# Patient Record
Sex: Female | Born: 1985 | Race: Black or African American | Hispanic: No | Marital: Single | Smoking: Current every day smoker
Health system: Southern US, Community
[De-identification: ages and names within clinical notes are randomized; demographics above are authoritative.]

---
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
## 2007-05-26 ENCOUNTER — Emergency Department (HOSPITAL_COMMUNITY): Admission: EM | Admit: 2007-05-26 | Discharge: 2007-05-26 | Payer: Self-pay | Admitting: Emergency Medicine

## 2007-09-26 ENCOUNTER — Emergency Department (HOSPITAL_COMMUNITY): Admission: EM | Admit: 2007-09-26 | Discharge: 2007-09-26 | Payer: Self-pay | Admitting: Emergency Medicine

## 2009-10-15 ENCOUNTER — Inpatient Hospital Stay (HOSPITAL_COMMUNITY): Admission: AD | Admit: 2009-10-15 | Discharge: 2009-10-15 | Payer: Self-pay | Admitting: Family Medicine

## 2009-10-15 ENCOUNTER — Ambulatory Visit: Payer: Self-pay | Admitting: Obstetrics and Gynecology

## 2010-10-22 LAB — URINE MICROSCOPIC-ADD ON

## 2010-10-22 LAB — CBC
HCT: 39 % (ref 36.0–46.0)
Hemoglobin: 12.9 g/dL (ref 12.0–15.0)
MCV: 100.3 fL — ABNORMAL HIGH (ref 78.0–100.0)
Platelets: 230 10*3/uL (ref 150–400)
RDW: 12.7 % (ref 11.5–15.5)

## 2010-10-22 LAB — URINALYSIS, ROUTINE W REFLEX MICROSCOPIC
Bilirubin Urine: NEGATIVE
Ketones, ur: 15 mg/dL — AB
Nitrite: NEGATIVE
pH: >9 — ABNORMAL HIGH (ref 5.0–8.0)

## 2010-10-22 LAB — WET PREP, GENITAL: Trich, Wet Prep: NONE SEEN

## 2011-04-19 LAB — PREGNANCY, URINE: Preg Test, Ur: NEGATIVE

## 2011-04-19 LAB — URINALYSIS, ROUTINE W REFLEX MICROSCOPIC
Ketones, ur: NEGATIVE
Nitrite: NEGATIVE
Specific Gravity, Urine: 1.013
Urobilinogen, UA: 1
pH: 7

## 2011-04-19 LAB — RPR: RPR Ser Ql: NONREACTIVE

## 2011-05-08 LAB — WET PREP, GENITAL
Clue Cells Wet Prep HPF POC: NONE SEEN
WBC, Wet Prep HPF POC: NONE SEEN

## 2011-05-09 ENCOUNTER — Inpatient Hospital Stay (INDEPENDENT_AMBULATORY_CARE_PROVIDER_SITE_OTHER)
Admission: RE | Admit: 2011-05-09 | Discharge: 2011-05-09 | Disposition: A | Discharge status: Home / Self Care | Payer: Self-pay | Source: Ambulatory Visit | Attending: Emergency Medicine | Admitting: Emergency Medicine

## 2011-05-09 DIAGNOSIS — S20219A Contusion of unspecified front wall of thorax, initial encounter: Secondary | ICD-10-CM

## 2011-08-10 IMAGING — CT CT ABDOMEN W/ CM
1 of 2 series · 16 of 32 positions shown, 20 images · IV contrast (OMNIPAQUE)
Comparison: None.

CLINICAL DATA: Abdominal pain.  Nausea, vomiting and diarrhea.

CT ABDOMEN AND PELVIS WITH CONTRAST
TECHNIQUE: Multidetector CT imaging of the abdomen and pelvis was
performed using the standard protocol following bolus
administration of intravenous contrast.
Contrast: 100 ml [id number].

[Series 2: routine abdomen/pelvis with · axial · 0.57mm/px · z∈[-259,+131]mm · 16 of 86 slices shown, 20 images]
[im 4/86  soft-tissue]
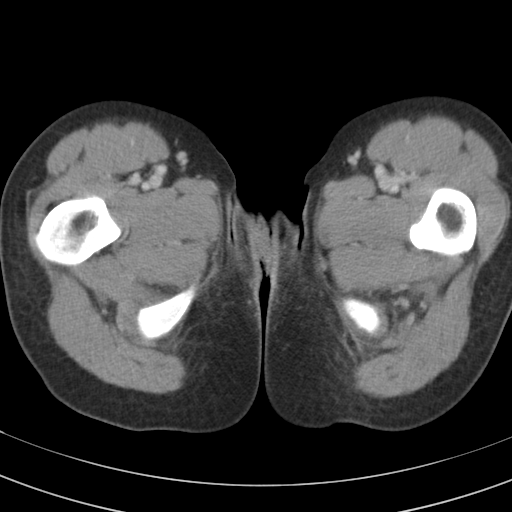
[im 4/86  bone]
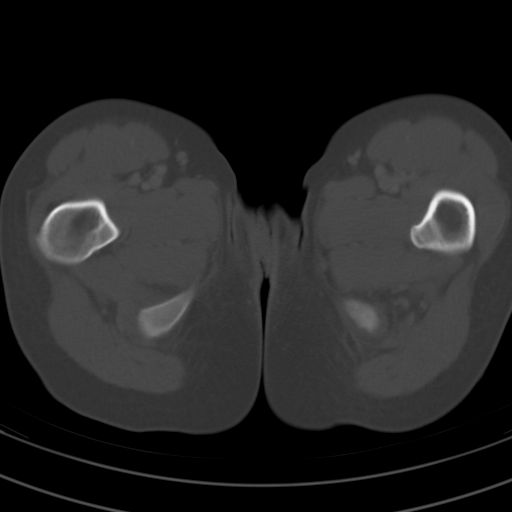
[im 11/86  soft-tissue]
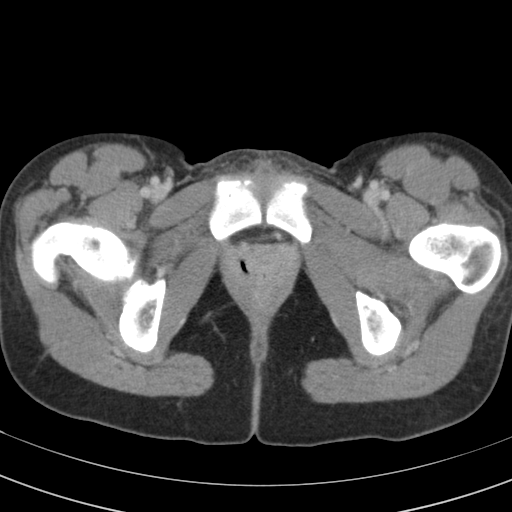
[im 18/86  soft-tissue]
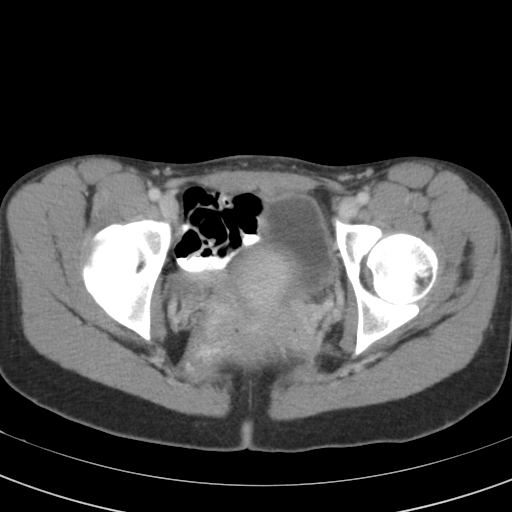
[im 24/86  soft-tissue]
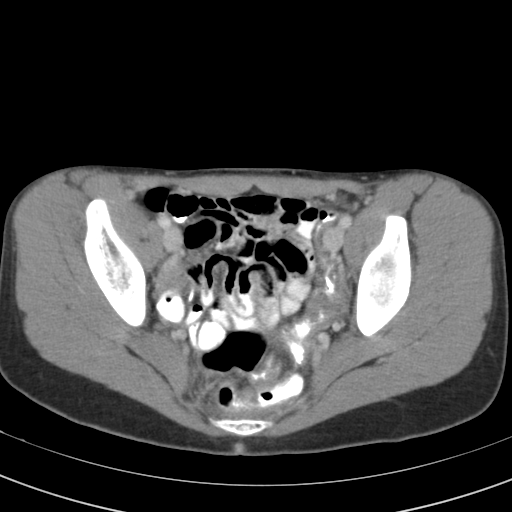
[im 28/86  soft-tissue]
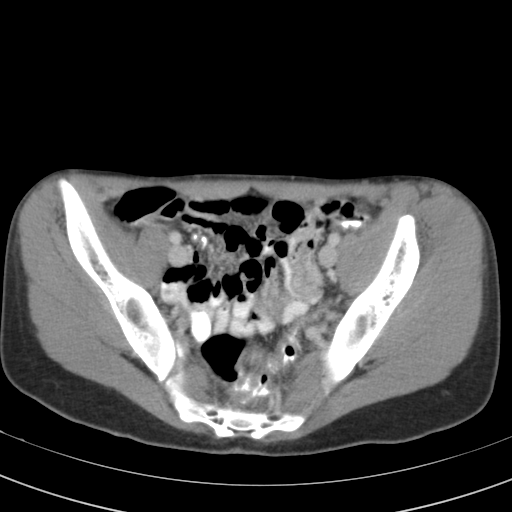
[im 35/86  soft-tissue]
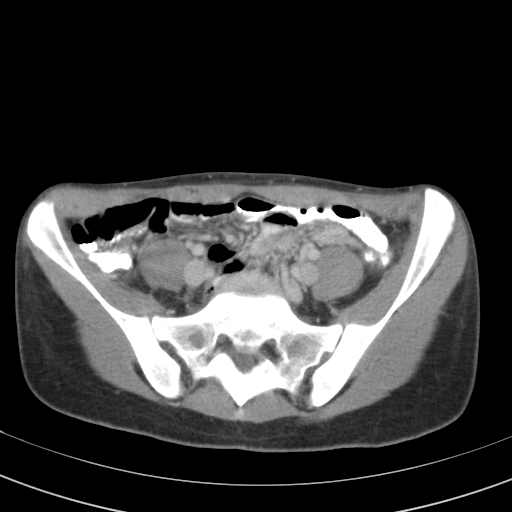
[im 41/86  soft-tissue]
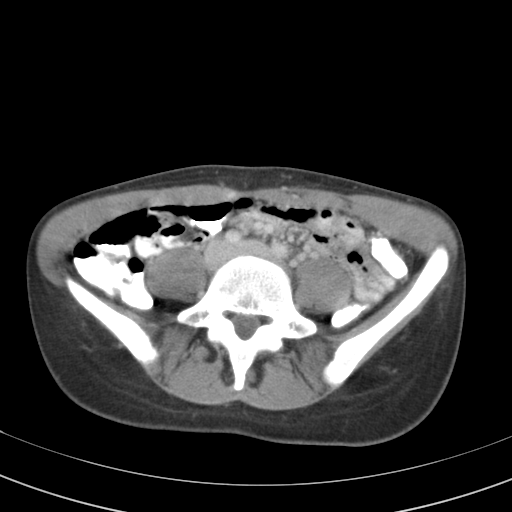
[im 45/86  soft-tissue]
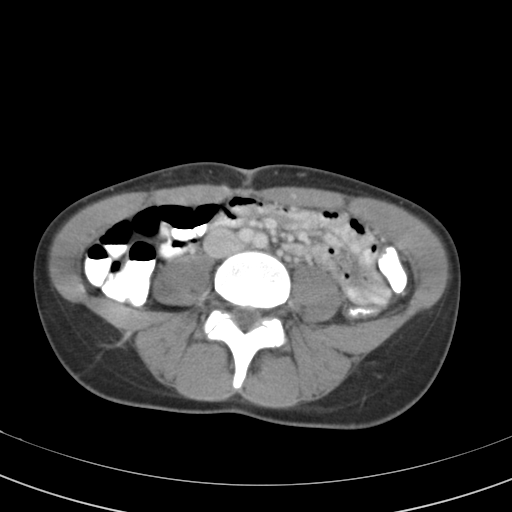
[im 52/86  soft-tissue]
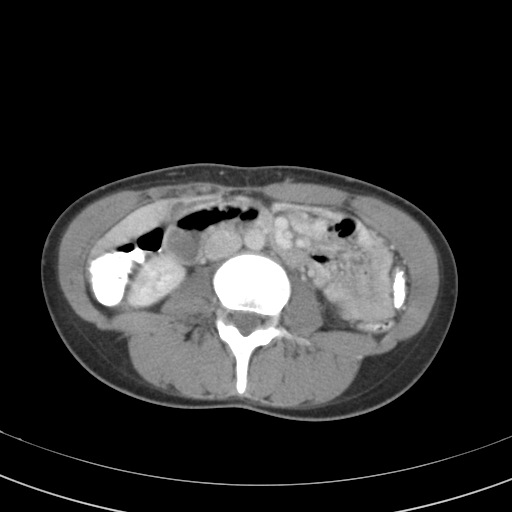
[im 52/86  bone]
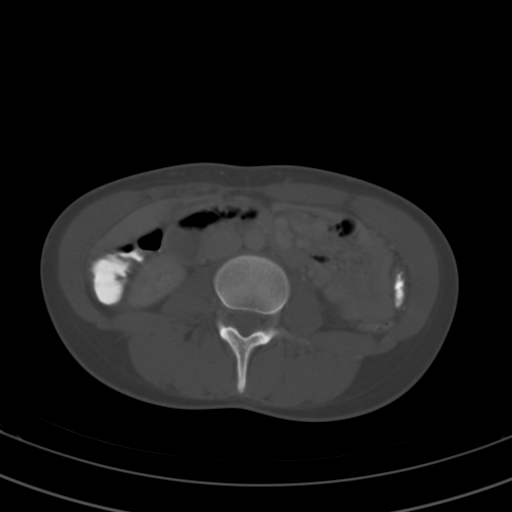
[im 58/86  soft-tissue]
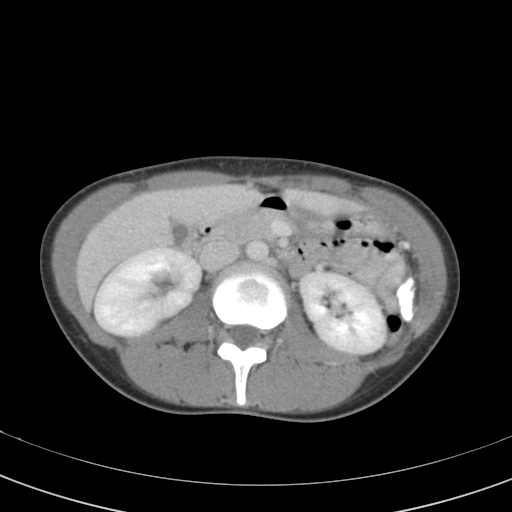
[im 65/86  soft-tissue]
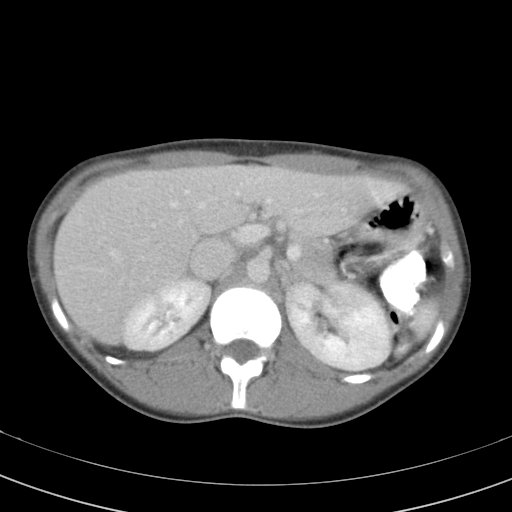
[im 69/86  soft-tissue]
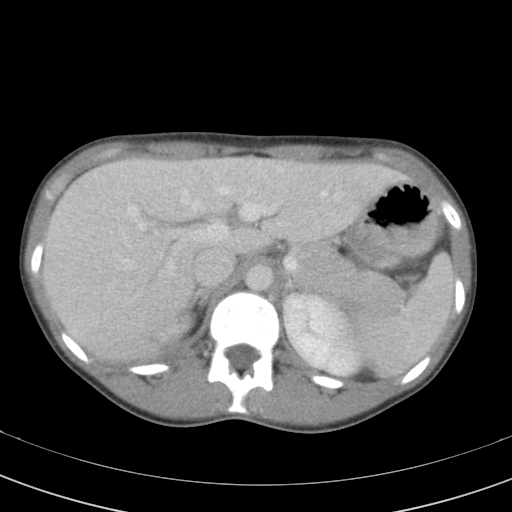
[im 72/86  lung]
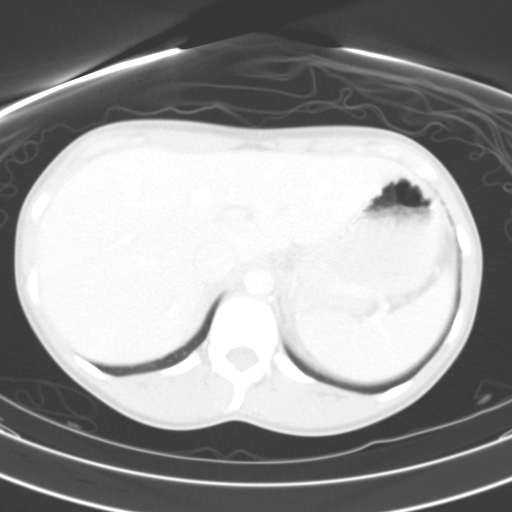
[im 75/86  soft-tissue]
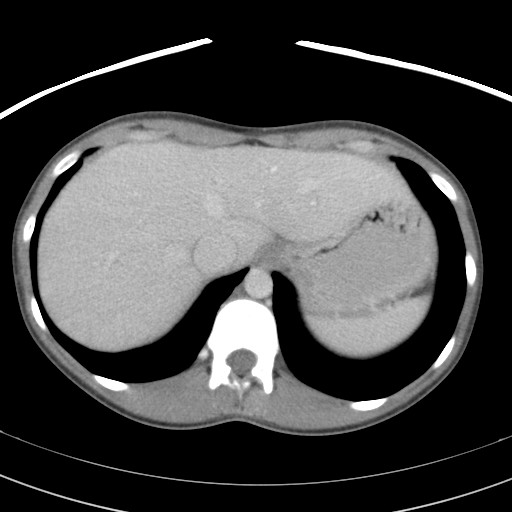
[im 75/86  lung]
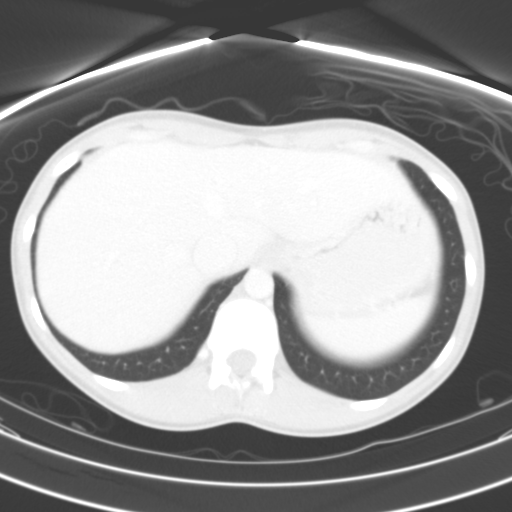
[im 79/86  lung]
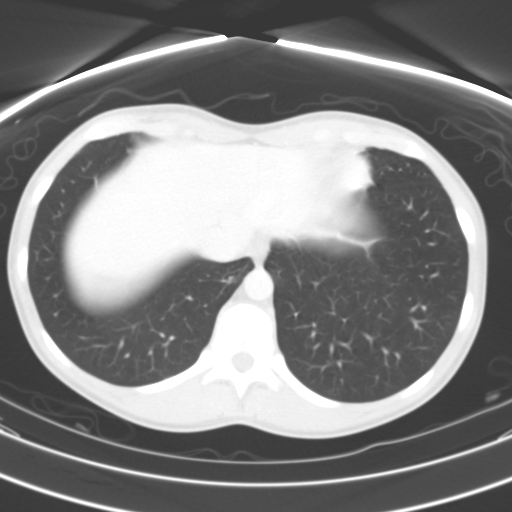
[im 82/86  soft-tissue]
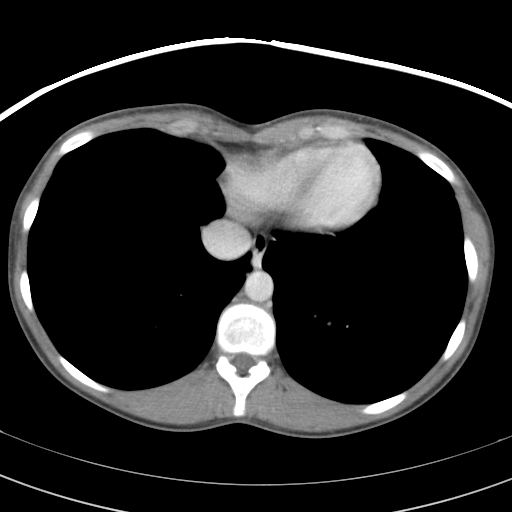
[im 82/86  lung]
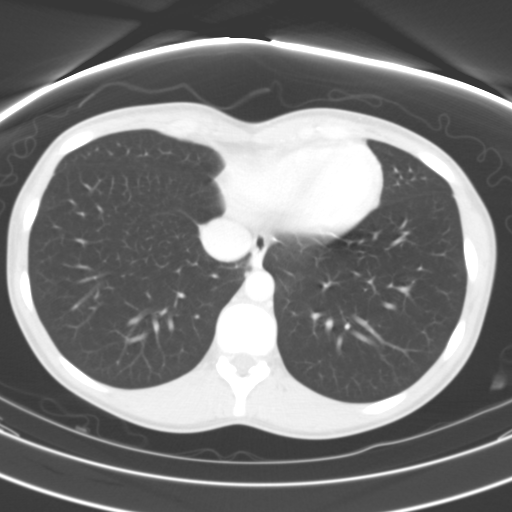

[16 of 32 positions shown; findings below may reference images not displayed]

FINDINGS: Lung bases are clear.  No pleural or pericardial
effusion.

The gallbladder, liver, spleen, adrenal glands, kidneys and
pancreas all appear normal.  Stomach and small and large bowel
normal appearance.  The appendix is normal in appearance.  Uterus,
adnexa and urinary bladder unremarkable.  There is no
lymphadenopathy or fluid.  No focal bony abnormality.
IMPRESSION: Normal CT abdomen and pelvis.

## 2012-02-16 ENCOUNTER — Encounter (HOSPITAL_COMMUNITY): Payer: Self-pay | Admitting: Emergency Medicine

## 2012-02-16 ENCOUNTER — Emergency Department (HOSPITAL_COMMUNITY)
Admission: EM | Admit: 2012-02-16 | Discharge: 2012-02-16 | Disposition: A | Discharge status: Home / Self Care | Payer: Self-pay | Attending: Emergency Medicine | Admitting: Emergency Medicine

## 2012-02-16 DIAGNOSIS — N946 Dysmenorrhea, unspecified: Secondary | ICD-10-CM | POA: Insufficient documentation

## 2012-02-16 LAB — POCT I-STAT, CHEM 8
Chloride: 111 mEq/L (ref 96–112)
Glucose, Bld: 100 mg/dL — ABNORMAL HIGH (ref 70–99)
HCT: 40 % (ref 36.0–46.0)
Hemoglobin: 13.6 g/dL (ref 12.0–15.0)
Potassium: 3.6 mEq/L (ref 3.5–5.1)
Sodium: 143 mEq/L (ref 135–145)

## 2012-02-16 LAB — PREGNANCY, URINE: Preg Test, Ur: NEGATIVE

## 2012-02-16 MED ORDER — HYDROCODONE-ACETAMINOPHEN 7.5-500 MG/15ML PO SOLN: 30.0000 mL | Freq: Four times a day (QID) | ORAL | Status: AC | PRN

## 2012-02-16 MED ORDER — HYDROMORPHONE HCL PF 1 MG/ML IJ SOLN
1.0000 mg | Freq: Once | INTRAMUSCULAR | Status: AC
  Administered 2012-02-16: 1 mg via INTRAMUSCULAR
  Filled 2012-02-16: qty 1

## 2012-02-16 MED ORDER — MORPHINE SULFATE 4 MG/ML IJ SOLN
4.0000 mg | Freq: Once | INTRAMUSCULAR | Status: AC
  Administered 2012-02-16: 4 mg via INTRAMUSCULAR
  Filled 2012-02-16: qty 1

## 2012-02-16 MED ORDER — ONDANSETRON 4 MG PO TBDP
4.0000 mg | ORAL_TABLET | Freq: Once | ORAL | Status: AC
  Administered 2012-02-16: 4 mg via ORAL
  Filled 2012-02-16: qty 1

## 2012-02-16 MED ORDER — KETOROLAC TROMETHAMINE 30 MG/ML IJ SOLN
60.0000 mg | Freq: Once | INTRAMUSCULAR | Status: AC
  Administered 2012-02-16: 60 mg via INTRAMUSCULAR
  Filled 2012-02-16: qty 1

## 2012-02-16 NOTE — ED Notes (Signed)
Patient is being discharged with her mother she will not be driving. Discharged with the teach back method patient verbalized an understanding.

## 2012-02-16 NOTE — ED Provider Notes (Signed)
History     CSN: [id number]  Arrival date & time 02/16/12  1056   First MD Initiated Contact with Patient 02/16/12 1119      Chief Complaint  Patient presents with  . Back Pain    (Consider location/radiation/quality/duration/timing/severity/associated sxs/prior treatment) HPI  Patient presents to the emergency department with severe menstrual cramps by EMS. She is accompanied by her mother states that ever since 2001 every month she has severe menstrual cramps. She has been seen in the ER for this before. She denies ever seeing a gynecologist for this problem and request a referral. She states that she is not sexually active males and only sleeps with women. She admits to having menstrual bleeding. She denies dysuria or vaginal pain or discharge. She denies passing clots. The mom who is with her is that she had the same thing growing up and had an early hysterectomy because of it. sHe is not on any hormone replacement therapy. Patient has been vomiting but has not had any diarrhea. The patient is uncomfortable but in no acute distress vital signs are stable although she is a little tachycardic. No past medical history on file.  No past surgical history on file.  No family history on file.  History  Substance Use Topics  . Smoking status: Not on file  . Smokeless tobacco: Not on file  . Alcohol Use: Not on file    OB History    Grav Para Term Preterm Abortions TAB SAB Ect Mult Living                  Review of Systems   HEENT: denies blurry vision or change in hearing PULMONARY: Denies difficulty breathing and SOB CARDIAC: denies chest pain or heart palpitations MUSCULOSKELETAL:  denies being unable to ambulate ABDOMEN AL: + dysmenorhea  GU: denies loss of bowel or urinary control NEURO: denies numbness and tingling in extremities SKIN: no new rashes PSYCH: patient denies anxiety or depression. NECK: Pt denies having neck pain     Allergies  Other  Home  Medications  No current outpatient prescriptions on file.  BP 129/81  Pulse 115  Resp 18  SpO2 100%  LMP 02/16/2012  Physical Exam  Nursing note and vitals reviewed. Constitutional: She appears well-developed and well-nourished. No distress.  HENT:  Head: Normocephalic and atraumatic.  Eyes: Pupils are equal, round, and reactive to light.  Neck: Normal range of motion. Neck supple.  Cardiovascular: Normal rate and regular rhythm.   Pulmonary/Chest: Effort normal.  Abdominal: Soft. She exhibits no distension. There is tenderness (suprapubic). There is no rebound and no guarding.    Neurological: She is alert.  Skin: Skin is warm and dry.    ED Course  Procedures (including critical care time)   Labs Reviewed  PREGNANCY, URINE   No results found.   1. Dysmenorrhea       MDM  Mom informs me that her daughter can not swallow pills and needs some pain medication and something for nausea. Pt given IM Morphine 4mg  and 4 ODT Zofran.   PT says Morphine brought her pain from  A "20/10" down to a 9/10. 60 mg IM tororadol given- pt states it did not do anything for her pain. 1mg  IM Dilaudid given. Pt given referral to Gynecology- i believe she needs to be started on some hormone therapy for her severe menstrual cramping. Neg pregnancy test.  Pt has been advised of the symptoms that warrant their return to the ED.  Patient has voiced understanding and has agreed to follow-up with the PCP or specialist.        Dorthula Matas, PA 02/16/12 1312

## 2012-02-16 NOTE — ED Notes (Signed)
Heat pack and warm blanked applied to adomen

## 2012-02-16 NOTE — ED Notes (Signed)
Patient brought in via [facility] with complaints of menstrual cramps since 6 am.

## 2012-02-16 NOTE — ED Notes (Signed)
Pt knows that urine is needed 

## 2012-02-17 NOTE — ED Provider Notes (Signed)
Medical screening examination/treatment/procedure(s) were performed by non-physician practitioner and as supervising physician I was immediately available for consultation/collaboration.  Hurman Horn, MD 02/17/12 2037

## 2012-10-09 ENCOUNTER — Emergency Department (HOSPITAL_COMMUNITY)
Admission: EM | Admit: 2012-10-09 | Discharge: 2012-10-09 | Disposition: A | Discharge status: Home / Self Care | Payer: Self-pay | Attending: Emergency Medicine | Admitting: Emergency Medicine

## 2012-10-09 ENCOUNTER — Encounter (HOSPITAL_COMMUNITY): Payer: Self-pay | Admitting: *Deleted

## 2012-10-09 DIAGNOSIS — R509 Fever, unspecified: Secondary | ICD-10-CM | POA: Insufficient documentation

## 2012-10-09 DIAGNOSIS — R112 Nausea with vomiting, unspecified: Secondary | ICD-10-CM | POA: Insufficient documentation

## 2012-10-09 DIAGNOSIS — N946 Dysmenorrhea, unspecified: Secondary | ICD-10-CM | POA: Insufficient documentation

## 2012-10-09 DIAGNOSIS — Z3202 Encounter for pregnancy test, result negative: Secondary | ICD-10-CM | POA: Insufficient documentation

## 2012-10-09 DIAGNOSIS — N949 Unspecified condition associated with female genital organs and menstrual cycle: Secondary | ICD-10-CM | POA: Insufficient documentation

## 2012-10-09 DIAGNOSIS — F17200 Nicotine dependence, unspecified, uncomplicated: Secondary | ICD-10-CM | POA: Insufficient documentation

## 2012-10-09 DIAGNOSIS — N938 Other specified abnormal uterine and vaginal bleeding: Secondary | ICD-10-CM | POA: Insufficient documentation

## 2012-10-09 LAB — POCT PREGNANCY, URINE: Preg Test, Ur: NEGATIVE

## 2012-10-09 LAB — URINE MICROSCOPIC-ADD ON

## 2012-10-09 LAB — URINALYSIS, ROUTINE W REFLEX MICROSCOPIC
Bilirubin Urine: NEGATIVE
Glucose, UA: NEGATIVE mg/dL
Ketones, ur: 40 mg/dL — AB
Nitrite: NEGATIVE
Protein, ur: NEGATIVE mg/dL
Specific Gravity, Urine: 1.025 (ref 1.005–1.030)
Urobilinogen, UA: 0.2 mg/dL (ref 0.0–1.0)
pH: 5 (ref 5.0–8.0)

## 2012-10-09 LAB — WET PREP, GENITAL
Clue Cells Wet Prep HPF POC: NONE SEEN
Yeast Wet Prep HPF POC: NONE SEEN

## 2012-10-09 MED ORDER — NAPROXEN 500 MG PO TABS: 500.0000 mg | ORAL_TABLET | Freq: Two times a day (BID) | ORAL | Status: DC

## 2012-10-09 MED ORDER — ONDANSETRON HCL 4 MG/2ML IJ SOLN
4.0000 mg | Freq: Once | INTRAMUSCULAR | Status: AC
  Administered 2012-10-09: 4 mg via INTRAVENOUS
  Filled 2012-10-09: qty 2

## 2012-10-09 MED ORDER — KETOROLAC TROMETHAMINE 30 MG/ML IJ SOLN
30.0000 mg | Freq: Once | INTRAMUSCULAR | Status: AC
  Administered 2012-10-09: 30 mg via INTRAVENOUS
  Filled 2012-10-09: qty 1

## 2012-10-09 MED ORDER — SODIUM CHLORIDE 0.9 % IV BOLUS (SEPSIS)
1000.0000 mL | Freq: Once | INTRAVENOUS | Status: AC
  Administered 2012-10-09: 1000 mL via INTRAVENOUS

## 2012-10-09 NOTE — ED Provider Notes (Signed)
History     CSN: [id number]  Arrival date & time 10/09/12  4098   First MD Initiated Contact with Patient 10/09/12 [phone]      Chief Complaint  Patient presents with  . Abdominal Pain    (Consider location/radiation/quality/duration/timing/severity/associated sxs/prior treatment) HPI  Michele Higgins is a 27 year old female with a past medical history significant for dysmenorrhea.  Patient presents this morning with complaint of onset of her period last night with associated nausea, vomiting, and severe lower abdominal pain.  This is consistent with her usual symptoms during her period.  She generally treats herself at home [facility] powder and Pepto-Bismol but was unable to hold down her medicines.  She states that she tries not to come to the emergency department but could not control her symptoms and came this morning.  The patient does not have any PCP or OB/GYN outpatient.  The patient denies any history of kidney stone, she denies any urinary symptoms or vaginal symptoms.  The patient denies a history of female to female vaginal intercourse.  The patient states that her symptoms today on the different from her normal menstrual symptoms.    History reviewed. No pertinent past medical history.  History reviewed. No pertinent past surgical history.  No family history on file.  History  Substance Use Topics  . Smoking status: Current Every Day Smoker -- 0.50 packs/day    Types: Cigarettes  . Smokeless tobacco: Not on file  . Alcohol Use: Yes     Comment: occasional    OB History   Grav Para Term Preterm Abortions TAB SAB Ect Mult Living                  Review of Systems  Constitutional: Positive for fever and chills.  Gastrointestinal: Positive for nausea, vomiting and abdominal pain.  Genitourinary: Positive for vaginal bleeding and pelvic pain. Negative for dysuria, flank pain, vaginal discharge, enuresis and vaginal pain.  Musculoskeletal: Negative.   Skin: Negative.      Allergies  Other  Home Medications  No current outpatient prescriptions on file.  BP 123/75  Pulse 80  Temp(Src) 98.7 F (37.1 C) (Oral)  Resp 20  Ht 5\' 7"  (1.702 m)  Wt 114 lb (51.71 kg)  BMI 17.85 kg/m2  SpO2 100%  LMP 10/08/2012  Physical Exam  Nursing note and vitals reviewed. Constitutional: She is oriented to person, place, and time. She appears well-developed and well-nourished. No distress.  Underweight, female appears uncomfortable.     HENT:  Head: Normocephalic and atraumatic.  Eyes: Conjunctivae are normal. No scleral icterus.  Neck: Normal range of motion.  Cardiovascular: Normal rate, regular rhythm and normal heart sounds.  Exam reveals no gallop and no friction rub.   No murmur heard. Pulmonary/Chest: Effort normal and breath sounds normal. No respiratory distress.  Abdominal: Soft. Bowel sounds are normal. She exhibits no distension and no mass. There is no tenderness. There is no guarding.  Non tender top palpation BL.  Musculoskeletal: Normal range of motion.  Neurological: She is alert and oriented to person, place, and time.  Skin: Skin is warm and dry. She is not diaphoretic.  Psychiatric: She has a normal mood and affect.   Pelvic exam: normal external genitalia, vulva, vagina, cervix, uterus and adnexa, +heavy menstrual bleeding.   ED Course  Procedures (including critical care time)  Labs Reviewed  URINALYSIS, ROUTINE W REFLEX MICROSCOPIC   No results found.   1. Dysmenorrhea   2. Menses painful  MDM  9:15 AM BP 123/75  Pulse 80  Temp(Src) 98.7 F (37.1 C) (Oral)  Resp 20  Ht 5\' 7"  (1.702 m)  Wt 114 lb (51.71 kg)  BMI 17.85 kg/m2  SpO2 100%  LMP 10/08/2012 Patient low risk for PID. BlL pain and no tendernes on abdominal exam.  Will perform pelvic exam with cultures.     11:17 AM Patient symptoms are gone now. Her pain is 0/10 nausea is resolved.g/c and wet prep pending.  We will contact the patient id there are  any abnormalities that need follow up at [phone]       Arthor Captain, PA-C 10/11/12 1810

## 2012-10-09 NOTE — ED Notes (Signed)
Hotpack given for comfort

## 2012-10-09 NOTE — ED Notes (Signed)
States here last in Dec for same. States has very heavy periods with lower abd cramping, n/v/d, occasionally abd pain gets really bad requiring medical treatment.  States this menstrual flow isn't any different than normal.

## 2012-10-09 NOTE — ED Notes (Signed)
C/o lower abd pain/cramping, n/v since last night. Reports it is menstrual cramps & that "every now & then they get really bad".

## 2012-10-12 ENCOUNTER — Telehealth (HOSPITAL_COMMUNITY): Payer: Self-pay | Admitting: Emergency Medicine

## 2012-10-13 NOTE — ED Provider Notes (Signed)
Medical screening examination/treatment/procedure(s) were performed by non-physician practitioner and as supervising physician I was immediately available for consultation/collaboration.   Michael Y. Ghim, MD 10/13/12 0031 

## 2012-10-17 ENCOUNTER — Telehealth (HOSPITAL_COMMUNITY): Payer: Self-pay | Admitting: Emergency Medicine

## 2012-10-17 NOTE — ED Notes (Signed)
Unable to contact patient via phone. Sent letter. °

## 2012-11-06 ENCOUNTER — Encounter (HOSPITAL_COMMUNITY): Payer: Self-pay | Admitting: Emergency Medicine

## 2012-11-06 ENCOUNTER — Emergency Department (INDEPENDENT_AMBULATORY_CARE_PROVIDER_SITE_OTHER)
Admission: EM | Admit: 2012-11-06 | Discharge: 2012-11-06 | Disposition: A | Discharge status: Home / Self Care | Payer: Medicaid Other | Source: Home / Self Care | Attending: Family Medicine | Admitting: Family Medicine

## 2012-11-06 DIAGNOSIS — N946 Dysmenorrhea, unspecified: Secondary | ICD-10-CM

## 2012-11-06 DIAGNOSIS — N944 Primary dysmenorrhea: Secondary | ICD-10-CM

## 2012-11-06 LAB — POCT PREGNANCY, URINE: Preg Test, Ur: NEGATIVE

## 2012-11-06 MED ORDER — KETOROLAC TROMETHAMINE 60 MG/2ML IM SOLN
INTRAMUSCULAR | Status: AC
  Filled 2012-11-06: qty 2

## 2012-11-06 MED ORDER — KETOROLAC TROMETHAMINE 60 MG/2ML IM SOLN
60.0000 mg | Freq: Once | INTRAMUSCULAR | Status: AC
  Administered 2012-11-06: 60 mg via INTRAMUSCULAR

## 2012-11-06 MED ORDER — ONDANSETRON HCL 4 MG PO TABS: 4.0000 mg | ORAL_TABLET | Freq: Four times a day (QID) | ORAL | Status: DC

## 2012-11-06 MED ORDER — ONDANSETRON 4 MG PO TBDP
ORAL_TABLET | ORAL | Status: AC
  Filled 2012-11-06: qty 1

## 2012-11-06 MED ORDER — KETOROLAC TROMETHAMINE 10 MG PO TABS: 10.0000 mg | ORAL_TABLET | Freq: Four times a day (QID) | ORAL | Status: DC | PRN

## 2012-11-06 MED ORDER — ONDANSETRON 4 MG PO TBDP
4.0000 mg | ORAL_TABLET | Freq: Once | ORAL | Status: AC
  Administered 2012-11-06: 4 mg via ORAL

## 2012-11-06 NOTE — ED Provider Notes (Signed)
History     CSN: [id number]  Arrival date & time 11/06/12  1027   First MD Initiated Contact with Patient 11/06/12 1108      Chief Complaint  Patient presents with  . Abdominal Pain    (Consider location/radiation/quality/duration/timing/severity/associated sxs/prior treatment) Patient is a 27 y.o. female presenting with abdominal pain. The history is provided by the patient.  Abdominal Pain Pain location:  Suprapubic Pain quality: cramping   Pain radiates to:  Suprapubic region Pain severity:  Moderate Onset quality:  Sudden Duration:  8 hours Timing:  Constant Progression:  Unchanged Chronicity:  Chronic Context comment:  Assoc with menses, onset, this am, typical monthly sx with n/v/d. Relieved by:  NSAIDs Associated symptoms: diarrhea, nausea, vaginal bleeding and vomiting   Associated symptoms: no dysuria   Associated symptoms comment:  Nl menstrual flow.   History reviewed. No pertinent past medical history.  History reviewed. No pertinent past surgical history.  No family history on file.  History  Substance Use Topics  . Smoking status: Current Every Day Smoker -- 0.50 packs/day    Types: Cigarettes  . Smokeless tobacco: Not on file  . Alcohol Use: Yes     Comment: occasional    OB History   Grav Para Term Preterm Abortions TAB SAB Ect Mult Living                  Review of Systems  Constitutional: Negative.   Gastrointestinal: Positive for nausea, vomiting, abdominal pain and diarrhea.  Genitourinary: Positive for vaginal bleeding. Negative for dysuria, frequency and flank pain.    Allergies  Other  Home Medications   Current Outpatient Rx  Name  Route  Sig  Dispense  Refill  . ketorolac (TORADOL) 10 MG tablet   Oral   Take 1 tablet (10 mg total) by mouth every 6 (six) hours as needed for pain.   20 tablet   0   . naproxen (NAPROSYN) 500 MG tablet   Oral   Take 1 tablet (500 mg total) by mouth 2 (two) times daily with a meal.   30  tablet   0   . ondansetron (ZOFRAN) 4 MG tablet   Oral   Take 1 tablet (4 mg total) by mouth every 6 (six) hours.   8 tablet   0     BP 128/81  Pulse 87  Temp(Src) 98.1 F (36.7 C) (Oral)  Resp 16  SpO2 100%  LMP 11/05/2012  Physical Exam  Nursing note and vitals reviewed. Constitutional: She is oriented to person, place, and time. She appears well-developed and well-nourished. She appears distressed.  Abdominal: Soft. Bowel sounds are normal. She exhibits no distension and no mass. There is tenderness in the suprapubic area. There is no rebound and no guarding.  Neurological: She is alert and oriented to person, place, and time.  Skin: Skin is warm and dry.    ED Course  Procedures (including critical care time)  Labs Reviewed  POCT PREGNANCY, URINE   No results found.   1. Primary dysmenorrhea       MDM          Linna Hoff, MD 11/06/12 1246

## 2012-11-06 NOTE — ED Notes (Signed)
Provided with saltines, ginger ale, and cup of ice

## 2012-11-06 NOTE — ED Notes (Signed)
Reports history of this abdominal pain, always associated with menstrual cycle.  Patient has recently been approved for medicaid and [place] app[place] [address] [facility]" on Monday.  Patient says she just cannot handle the pain.  Patient reports diarrhea and vomiting.

## 2012-11-28 ENCOUNTER — Telehealth (HOSPITAL_COMMUNITY): Payer: Self-pay | Admitting: Emergency Medicine

## 2012-11-28 NOTE — ED Notes (Signed)
No response to letter sent after 30 days. Chart sent to Medical Records. °

## 2014-08-25 ENCOUNTER — Emergency Department (HOSPITAL_COMMUNITY)
Admission: EM | Admit: 2014-08-25 | Discharge: 2014-08-25 | Disposition: A | Discharge status: Home / Self Care | Payer: Medicaid Other | Attending: Emergency Medicine | Admitting: Emergency Medicine

## 2014-08-25 ENCOUNTER — Encounter (HOSPITAL_COMMUNITY): Payer: Self-pay | Admitting: Emergency Medicine

## 2014-08-25 ENCOUNTER — Emergency Department (HOSPITAL_COMMUNITY): Payer: Medicaid Other

## 2014-08-25 DIAGNOSIS — Z79899 Other long term (current) drug therapy: Secondary | ICD-10-CM | POA: Insufficient documentation

## 2014-08-25 DIAGNOSIS — S86892A Other injury of other muscle(s) and tendon(s) at lower leg level, left leg, initial encounter: Secondary | ICD-10-CM

## 2014-08-25 DIAGNOSIS — M79605 Pain in left leg: Secondary | ICD-10-CM | POA: Insufficient documentation

## 2014-08-25 DIAGNOSIS — Z791 Long term (current) use of non-steroidal anti-inflammatories (NSAID): Secondary | ICD-10-CM | POA: Insufficient documentation

## 2014-08-25 NOTE — Progress Notes (Signed)
Orthopedic Tech Progress Note Patient Details:  Michele Higgins 02/07/1986 [id number]  Ortho Devices Type of Ortho Device: CAM walker Ortho Device/Splint Location: lle Ortho Device/Splint Interventions: Application   Citlalic Norlander 08/25/2014, 11:06 AM

## 2014-08-25 NOTE — Discharge Instructions (Signed)
Please call your doctor for a followup appointment within 24-48 hours. When you talk to your doctor please let them know that you were seen in the emergency department and have them acquire all of your records so that they can discuss the findings with you and formulate a treatment plan to fully care for your new and ongoing problems. Please follow-up with orthopedics Please rest, ice, elevate Please massage Please keep cam walker boot on for approximately one week Please avoid any physical strenuous activity Please continue to monitor symptoms closely and if symptoms are to worsen or change (fever greater than 101, chills, sweating, nausea, vomiting, chest pain, shortness of breathe, difficulty breathing, weakness, numbness, tingling, worsening or changes to pain pattern, fall, injury, complete loss of sensation, swelling, changes to skin color) please report back to the Emergency Department immediately.   Shin Splints Shin splints is a painful condition that is felt on the shinbone or in the muscles on either side of the bone (front of your lower leg). Shin splints happen when physical activities, such as sports or other demanding exercise, leads to inflammation of the muscles, tendons, and the thin layer that covers the shinbone.  CAUSES   Overuse of muscles.  Repetitive activities.  Flat feet or rigid arches. Activities that could contribute to shin splints include:  A sudden increase in exercise time.  Starting a new, demanding activity.  Running up hills or long distances.  Playing sports with sudden starts and stops.  A poor warm up.  Old or worn-out shoes. SYMPTOMS   Pain on the front of the leg.  Pain while exercising or at rest. DIAGNOSIS  Your caregiver will diagnose shin splints from a history of your symptoms and a physical exam. You may be observed as you walk or run. X-ray exams or further testing may be needed to rule out other problems, such as a stress fracture,  which also causes lower leg pain. TREATMENT  Your caregiver may decide on the treatment based on your age, history, health, and how bad the pain is. Most cases of shin splints can be managed by one or more of the following:  Resting.  Reducing the length and intensity of your exercise.  Stopping the activity that causes shin pain.  Taking medicines to control the inflammation.  Icing, massaging, stretching, and strengthening the affected area.  Getting shoes with rigid heels, shock absorption, and a good arch support. HOME CARE INSTRUCTIONS   Resume activity steadily or as directed by your caregiver.  Restart your exercise sessions with non-weight-bearing exercises, such as cycling or swimming.  Stop running if the pain returns.  Warm up properly before exercising.  Run on a level and fairly firm surface.  Gradually change the intensity of an exercise.  Limit increases in running distance by no more than 5 to 10% weekly. This means if you are running 5 miles, you can only increase your run by 1/2 a mile at a time.  Change your athletic shoes every 6 months, or every 350 to 450 miles. SEEK MEDICAL CARE IF:   Symptoms continue or worsen even after treatment.  The location, intensity, or type of pain changes over time. SEEK IMMEDIATE MEDICAL CARE IF:   You have severe pain.  You have trouble walking. MAKE SURE YOU:  Understand these instructions.  Will watch your condition.  Will get help right away if you are not doing well or get worse. Document Released: 07/12/2000 Document Revised: 10/07/2011 Document Reviewed: 12/30/2010 ExitCare  Patient Information 2015 [place], [place]. This information is not intended to replace advice given to you by your health care provider. Make sure you discuss any questions you have with your health care provider.   Emergency Department Resource Guide 1) Find a Doctor and Pay Out of Pocket Although you won't have to find out who is  covered by your insurance plan, it is a good idea to ask around and get recommendations. You will then need to call the office and see if the doctor you have chosen will accept you as a new patient and what types of options they offer for patients who are self-pay. Some doctors offer discounts or will set up payment plans for their patients who do not have insurance, but you will need to ask so you aren't surprised when you get to your appointment.  2) Contact Your Local Health Department Not all health departments have doctors that can see patients for sick visits, but many do, so it is worth a call to see if yours does. If you don't know where your local health department is, you can check in your phone book. The CDC also has a tool to help you locate your state's health department, and many state websites also have listings of all of their local health departments.  3) Find a Walk-in Clinic If your illness is not likely to be very severe or complicated, you may want to try a walk in clinic. These are popping up all over the country in pharmacies, drugstores, and shopping centers. They're usually staffed by nurse practitioners or physician assistants that have been trained to treat common illnesses and complaints. They're usually fairly quick and inexpensive. However, if you have serious medical issues or chronic medical problems, these are probably not your best option.  No Primary Care Doctor: - Call Health Connect at  [phone] - they can help you locate a primary care doctor that  accepts your insurance, provides certain services, etc. - Physician Referral Service- [phone]  Chronic Pain Problems: Organization         Address  Phone   Notes  [facility]  [phone] Patients need to be referred by their primary care doctor.   Medication Assistance: Organization         Address  Phone   Notes  [facility] [address] [place], [place] [address] [phone] --Must be a resident of [place] [facility] [place] -- Must have NO insurance coverage whatsoever (no Medicaid/ Medicare, etc.) -- The pt. MUST have a primary care doctor that directs their care regularly and follows them in the community   MedAssist  [phone]   [employer]  [phone]    Agencies that provide inexpensive medical care: Organization         Address  Phone   Notes  [facility]  [phone]   [facility]    [phone]   [facility] [address] [place], [place] [address] [phone]   [facility] [address], [place] [phone]   [facility]anned [facility]    [phone]   [facility]    [phone]   [facility]  [address], [place] Phone:  [phone], Fax:  [phone] Hours of Operation:  9 am - 6 pm, M-F.  Also accepts Medicaid/Medicare and self-pay.  [facility]  [address], [place] Phone: [phone], Fax: [phone]. Hours of Operation:  8:30 am - 5:30 pm, M-F.  Also accepts Medicaid and self-pay.  [facility] [place] [address], [place] Phone: [phone]   [facility] [address] Natasha Bence [place], [place] [phone], Ext. [phone] Mondays & Thursdays: 7-9 AM.  First 15 patients are seen on a first come, first serve basis.    Medicaid-accepting [facility] Providers:  Organization         Address  Phone   Notes  [facility] [address],  [phone] Also accepts self-pay patients.  [facility] [address] Laurell Josephs [place], [place]  [phone]   [facility] [address], [place] [phone]   [facility] [address], [place] [phone]   Renaye Rakers [address], [place]   [phone] Only accepts [place] Access [place] patients after they have their name applied to their card.   Self-Pay (no insurance) in [facility]:  Organization         Address  Phone   Notes  Sickle Cell Patients, [facility] [address] [place], [place] [phone]   [facility] [address] [place], [place] [phone]   [facility]  [address], [place] [phone]   [facility]/Dr. Osei-Bonsu  [address] [place] or [address], [place] [phone] Phone number for both [place] and [place] locations is the same.  [facility] [address] [place] [phone]   [facility] [address], [place] or [address] [phone]   [facility] [address] [place] [phone], phone; [phone], fax Sees patients 1st and 3rd Saturday of every month.  Must not qualify for public or private insurance (i.e. Medicaid, Medicare, [place] Health Choice, Veterans' Benefits)  Household income should be no more than 200% of the poverty level The clinic cannot treat you if you are pregnant or think you are pregnant  Sexually transmitted diseases are not treated at the clinic.    Dental Care: Organization         Address  Phone  Notes  [facility] [address] [place], [place] [phone] Accepts children up to age 59 who are enrolled in [place] or [place] Health Choice; pregnant women with a Medicaid card; and children who have applied for Medicaid or [place] Health Choice, but were declined, whose parents can pay a reduced fee at time of service.  [facility]  [address], [place] [phone] Accepts children up to age 20 who are enrolled  in [place] or [place] Health Choice; pregnant women with a Medicaid card; and children who have applied for Medicaid or [place] Health Choice, but were declined, whose parents can pay a reduced fee at time of service.  [facility]  [address] [place], [place] [phone] Patients are seen by appointment only. Walk-ins are not accepted. [facility] will see patients 25 years of age and older. Monday - Tuesday (8am-5pm) Most Wednesdays (8:30-5pm) $30 per visit, cash only  [facility]  [address], [facility] [phone] Patients are seen by appointment only. Walk-ins are not accepted. [facility] will see patients 50 years of age and older. One Wednesday Evening (Monthly: Volunteer Based).  $30 per visit, cash only  [employer]  [phone] for adults; Children under age 71, call Graduate Pediatric Dentistry at [phone]. Children aged 72-14, please call [phone] to request a pediatric application.  Dental services are provided in all areas of dental care including fillings, crowns and bridges, complete and partial dentures, implants, gum treatment, root canals, and extractions. Preventive care is also provided. Treatment is provided to both adults and children. Patients are selected via a lottery and there is often a waiting list.   [facility] [address] [place]  [phone] [email or web address]   [facility] [address] [place], [place] [phone], Ext. [phone] Second and Fourth Thursday of each month, opens at 6:30 AM; Clinic ends at 9 AM.  Patients are seen on a first-come first-served basis, and a limited number are seen during each clinic.   [facility]  [address] Ether Griffins [place], [place] [phone]   Eligibility Requirements You must have lived in [place], [place], or [place] counties for at least the last three months.   You cannot be  eligible for state or federal sponsored [place], including [employer], [place], or [employer].   You generally cannot be eligible for healthcare insurance through your employer.    How to apply: Eligibility screenings are held every Tuesday and Wednesday afternoon from 1:00 pm until 4:00 pm. You do not need an appointment for the interview!  [facility] [address] [place], [place] [phone]   [facility]  [phone]   [facility] Health Department  [phone]   [facility] Health Department  [phone]    Behavioral Health Resources in the Community: Intensive Outpatient Programs Organization         Address  Phone  Notes  [facility] [address] [place], [place] [phone]   [facility] [address] [place], [place] [phone]   [facility] [address] [place], [place]  [phone]   [facility] [address],  [place], [place] [phone] or [phone]   Substance Abuse Resources Organization         Address  Phone  Notes  [facility] Drug [facility]  [phone]   [facility]  [phone]   [place]  [phone]   Floydene Flock  [phone]   Residential & Outpatient Substance Abuse Program  [phone]   Psychological Services Organization         Address  Phone  Notes  [facility] Behavioral Health  [phone]   [facility]  [phone]   [facility] [address] [place] [phone] or [phone]    Mobile Crisis Teams Organization         Address  Phone  Notes  [facility], Mobile Crisis Care Unit  [phone]   [facility]  [address] [place], [place] [phone]   Doristine Locks [address] [place] [phone]    Self-Help/Support Groups Organization          Address  Phone             Notes  [facility] [place] - variety of support groups  [phone]- [phone] Call for more information  Narcotics Anonymous (NA), [facility] [address], [employer] [place]  2 meetings at this location   Statistician         Address  Phone  Notes  [facility] [address],    [place]  [phone]   [facility]  [address], [place] [address], [place], [place] [phone]   [facility] [address] [place], [place] [phone] Admissions: 8am-3pm M-F  [facility] [address]    [place], [place] [phone]   [facility] [address] [place], [place], [place] [phone]   [facility] [address],  [place], [place] [phone]   [facility] - Intensive Outpatient [address] Laurell Josephs [address], [place], [place] [phone]   [facility] ([facility] [address]  [place], [place] [phone] or [phone]   [facility] ([facility]) [address], [place], [place] [phone] Accepts Medicaid  [facility] [address],  [place] [phone] Substance Abuse/Addiction Treatment   [facility] Organization         Address  Phone  Notes  [facility]  [phone]   Angie Fava, PhD [address] Ervin Knack [place], [place]   [phone] or [phone]   [facility]   [address] [place], [place] [phone]   [facility] [address] [place], [place] [phone] Insurance/Medicaid/sponsorship through [facility] [place] and Families [address]                                    [place], [place] [phone] Therapy/tele-psych/case  [facility] [address], [place] [phone]    Dr. Lolly Mustache  [phone]   [facility]  United Way  [facility] Dept. 1) [address] [place] 2) [address] [place] 3)  [address], [place] [phone]  [phone]   [facility] Child Abuse Hotline [phone])179-5174 or [phone] (After Hours)

## 2014-08-25 NOTE — ED Provider Notes (Signed)
CSN: [id number]     Arrival date & time 08/25/14  4098 History  This chart was scribed for non-physician practitioner, Raymon Mutton, PA-C, working with Juliet Rude. Rubin Payor, MD, by Ronney Lion, ED Scribe. This patient was seen in room TR09C/TR09C and the patient's care was started at 10:11 AM.    Chief Complaint  Patient presents with  . Leg Pain   The history is provided by the patient. No language interpreter was used.     HPI Comments: Michele Higgins is a 29 y.o. female with no significant past medical history who presents to the Emergency Department complaining of sharp, left anterior tibial pain when dorsiflexing her foot, with onset 2 days ago and worsening last night. She reports pain on liftoff of foot when walking. Patient states she has been walking frequently, as her main way of transportation. She denies falls or injury. This is a new problem. Palpation exacerbates the pain. She states she had no prior treatment for the problem. She denies running. She denies swelling, discoloration, numbness, tingling, weakness, or ankle or foot pain. Patient does not have a PCP.    History reviewed. No pertinent past medical history. History reviewed. No pertinent past surgical history. No family history on file. History  Substance Use Topics  . Smoking status: Current Every Day Smoker -- 0.50 packs/day    Types: Cigarettes  . Smokeless tobacco: Not on file  . Alcohol Use: No     Comment: occasional   OB History    No data available     Review of Systems  Musculoskeletal: Positive for myalgias and arthralgias.  Skin: Negative for color change.  Neurological: Negative for weakness and numbness.      Allergies  Other    Home Medications   Prior to Admission medications   Medication Sig Start Date End Date Taking? Authorizing Provider  ketorolac (TORADOL) 10 MG tablet Take 1 tablet (10 mg total) by mouth every 6 (six) hours as needed for pain. 11/06/12   Linna Hoff, MD   naproxen (NAPROSYN) 500 MG tablet Take 1 tablet (500 mg total) by mouth 2 (two) times daily with a meal. 10/09/12   Arthor Captain, PA-C  ondansetron (ZOFRAN) 4 MG tablet Take 1 tablet (4 mg total) by mouth every 6 (six) hours. 11/06/12   Linna Hoff, MD   BP 126/84 mmHg  Pulse 83  Temp(Src) 97.7 F (36.5 C) (Oral)  Resp 16  SpO2 100%  LMP 07/29/2014 Physical Exam  Constitutional: She is oriented to person, place, and time. She appears well-developed and well-nourished. No distress.  HENT:  Head: Normocephalic and atraumatic.  Eyes: Conjunctivae and EOM are normal. Right eye exhibits no discharge. Left eye exhibits no discharge.  Neck: Normal range of motion. Neck supple.  Cardiovascular: Normal rate, regular rhythm and normal heart sounds.  Exam reveals no friction rub.   No murmur heard. Pulses:      Radial pulses are 2+ on the right side, and 2+ on the left side.       Dorsalis pedis pulses are 2+ on the right side, and 2+ on the left side.  Cap refill less than 3 seconds  Pulmonary/Chest: Effort normal and breath sounds normal. No respiratory distress. She has no wheezes. She has no rales.  Musculoskeletal: Normal range of motion. She exhibits tenderness. She exhibits no edema.       Left lower leg: She exhibits tenderness. She exhibits no bony tenderness, no swelling, no edema, no  deformity and no laceration.       Legs: Negative swelling, erythema, inflammation, lesions, sores, deformities, open wounds identified to the left leg-tib-fib region. Tenderness upon palpation to the anterior aspect of the left tib-fib. Full range of motion to left lower extremities identified without difficulty-discomfort noted with dorsi flexion of the left foot. Patient able to wiggle toes without difficulty at the left foot.  Neurological: She is alert and oriented to person, place, and time. No cranial nerve deficit. She exhibits normal muscle tone. Coordination normal.  Cranial nerves III-XII  grossly intact Strength 5+/5+ to lower extremities bilaterally with resistance applied, equal distribution noted Sensation intact with differentiation sharp and dull touch Gait proper, proper balance - negative sway, negative drift, negative step-offs  Skin: Skin is warm and dry. No rash noted. She is not diaphoretic. No erythema.  Psychiatric: She has a normal mood and affect. Her behavior is normal. Thought content normal.  Nursing note and vitals reviewed.   ED Course  Procedures (including critical care time)  DIAGNOSTIC STUDIES: Oxygen Saturation is 100% on room air, normal by my interpretation.    COORDINATION OF CARE: 10:18 AM - Discussed treatment plan with pt at bedside which includes left leg XR and CAM walker boot, and pt agreed to plan.     Labs Review Labs Reviewed - No data to display  Imaging Review Dg Tibia/fibula Left  08/25/2014   CLINICAL DATA:  Two day history of pain.  No known injury  EXAM: LEFT TIBIA AND FIBULA - 2 VIEW  COMPARISON:  None.  FINDINGS: Frontal and lateral views were obtained. No fracture or dislocation. Joint spaces appear intact. No abnormal periosteal reaction. Joint spaces appear intact.  IMPRESSION: No abnormality noted radiographically.   Electronically Signed   By: Bretta BangWilliam  Woodruff M.D.   On: 08/25/2014 10:44     EKG Interpretation None      MDM   Final diagnoses:  Shin splints, left, initial encounter     Medications - No data to display  Filed Vitals:   08/25/14 0954  BP: 126/84  Pulse: 83  Temp: 97.7 F (36.5 C)  TempSrc: Oral  Resp: 16  SpO2: 100%   I personally performed the services described in this documentation, which was scribed in my presence. The recorded information has been reviewed and is accurate.  Plain film of left tib-fib negative for acute osseous injury. Suspicion to be shin splints secondary to continuous use. Negative signs dislocation or patellar tendon rupture or quadricep tendon rupture.  Negative focal neurological deficits. Pulses palpable and strong. Cap refill less than 3 seconds. Negative signs of ischemia. Patient stable, afebrile. Patient septic appearing. Discharged patient. Patient placed in cam walker boot for comfort purposes. Referred patient to orthopedics. Discussed with patient to rest, ice, elevate. Discussed with patient to closely monitor symptoms and if symptoms are to worsen or change to report back to the ED - strict return instructions given.  Patient agreed to plan of care, understood, all questions answered.   Raymon MuttonMarissa Harshal Sirmon, PA-C 08/25/14 1058  Nathan R. Rubin PayorPick[place], MD 08/26/14 [phone]

## 2014-08-25 NOTE — ED Notes (Signed)
Started 2 days ago with left, anterior tibial pain when flexing foot. States she "walks a lot" and has started a new job where she is on her feet a lot.

## 2016-04-30 ENCOUNTER — Emergency Department (HOSPITAL_COMMUNITY): Payer: Medicaid Other

## 2016-04-30 ENCOUNTER — Emergency Department (HOSPITAL_COMMUNITY)
Admission: EM | Admit: 2016-04-30 | Discharge: 2016-04-30 | Disposition: A | Discharge status: Home / Self Care | Payer: Medicaid Other | Attending: Emergency Medicine | Admitting: Emergency Medicine

## 2016-04-30 ENCOUNTER — Encounter (HOSPITAL_COMMUNITY): Payer: Self-pay | Admitting: Emergency Medicine

## 2016-04-30 DIAGNOSIS — F17210 Nicotine dependence, cigarettes, uncomplicated: Secondary | ICD-10-CM | POA: Insufficient documentation

## 2016-04-30 DIAGNOSIS — R0789 Other chest pain: Secondary | ICD-10-CM | POA: Insufficient documentation

## 2016-04-30 LAB — I-STAT TROPONIN, ED: TROPONIN I, POC: 0 ng/mL (ref 0.00–0.08)

## 2016-04-30 LAB — CBC
HEMATOCRIT: 40.4 % (ref 36.0–46.0)
Hemoglobin: 13.1 g/dL (ref 12.0–15.0)
MCH: 31.3 pg (ref 26.0–34.0)
MCHC: 32.4 g/dL (ref 30.0–36.0)
MCV: 96.4 fL (ref 78.0–100.0)
PLATELETS: 246 10*3/uL (ref 150–400)
RBC: 4.19 MIL/uL (ref 3.87–5.11)
RDW: 13.4 % (ref 11.5–15.5)
WBC: 8.6 10*3/uL (ref 4.0–10.5)

## 2016-04-30 LAB — BASIC METABOLIC PANEL
Anion gap: 10 (ref 5–15)
BUN: 8 mg/dL (ref 6–20)
CALCIUM: 9.4 mg/dL (ref 8.9–10.3)
CO2: 20 mmol/L — ABNORMAL LOW (ref 22–32)
Chloride: 107 mmol/L (ref 101–111)
Creatinine, Ser: 0.62 mg/dL (ref 0.44–1.00)
GFR calc Af Amer: >60 mL/min (ref >60)
GLUCOSE: 81 mg/dL (ref 65–99)
POTASSIUM: 3.7 mmol/L (ref 3.5–5.1)
SODIUM: 137 mmol/L (ref 135–145)

## 2016-04-30 MED ORDER — IBUPROFEN 100 MG/5ML PO SUSP
400.0000 mg | Freq: Once | ORAL | Status: AC
  Administered 2016-04-30: 400 mg via ORAL
  Filled 2016-04-30: qty 20

## 2016-04-30 NOTE — ED Provider Notes (Signed)
[facility] Provider Note   CSN: [id number] Arrival date & time: 04/30/16  1212     History   Chief Complaint Chief Complaint  Patient presents with  . Chest Pain    HPI Michele Higgins is a 30 y.o. female.  The history is provided by the patient. No language interpreter was used.  Chest Pain   This is a new problem. The current episode started yesterday. The problem occurs constantly. The pain is associated with breathing. The pain is at a severity of 9/10. The pain is severe. The pain does not radiate. Duration of episode(s) is 2 days. The symptoms are aggravated by certain positions. Pertinent negatives include no cough and no weakness. She has tried nothing for the symptoms. The treatment provided no relief. Risk factors include oral contraceptive use and smoking/tobacco exposure.  Pertinent negatives for past medical history include no CAD.  Procedure history is negative for cardiac catheterization.    History reviewed. No pertinent past medical history.  There are no active problems to display for this patient.   History reviewed. No pertinent surgical history.  OB History    No data available       Home Medications    Prior to Admission medications   Medication Sig Start Date End Date Taking? Authorizing Provider  ketorolac (TORADOL) 10 MG tablet Take 1 tablet (10 mg total) by mouth every 6 (six) hours as needed for pain. 11/06/12   Linna Hoff, MD  naproxen (NAPROSYN) 500 MG tablet Take 1 tablet (500 mg total) by mouth 2 (two) times daily with a meal. 10/09/12   Arthor Captain, PA-C  ondansetron (ZOFRAN) 4 MG tablet Take 1 tablet (4 mg total) by mouth every 6 (six) hours. 11/06/12   Linna Hoff, MD    Family History History reviewed. No pertinent family history.  Social History Social History  Substance Use Topics  . Smoking status: Current Every Day Smoker    Packs/day: 0.50    Types: Cigarettes  . Smokeless tobacco: Never Used  . Alcohol use  Yes     Comment: occasional     Allergies   Other   Review of Systems Review of Systems  Respiratory: Negative for cough.   Cardiovascular: Positive for chest pain.  Neurological: Negative for weakness.  All other systems reviewed and are negative.    Physical Exam Updated Vital Signs BP 113/72   Pulse 62   Temp 98 F (36.7 C) (Oral)   Resp 17   Ht 5\' 7"  (1.702 m)   Wt 52.2 kg   LMP 04/28/2016   SpO2 100%   BMI 18.01 kg/m   Physical Exam  HENT:  Head: Normocephalic and atraumatic.  Right Ear: External ear normal.  Left Ear: External ear normal.  Nose: Nose normal.  Mouth/Throat: Oropharynx is clear and moist.  Eyes: Pupils are equal, round, and reactive to light.  Neck: Normal range of motion.  Cardiovascular: Normal rate.   Tender left chest wall  Pulmonary/Chest: Effort normal.  Abdominal: Soft.  Musculoskeletal: Normal range of motion.  Neurological: She is alert.  Skin: Skin is warm.  Psychiatric: She has a normal mood and affect.  Nursing note and vitals reviewed.    ED Treatments / Results  Labs (all labs ordered are listed, but only abnormal results are displayed) Labs Reviewed  BASIC METABOLIC PANEL - Abnormal; Notable for the following:       Result Value   CO2 20 (*)  All other components within normal limits  CBC  D-DIMER, QUANTITATIVE (NOT AT [facility])  I-STAT TROPOININ, ED    EKG  EKG Interpretation  Date/Time:  Tuesday April 30 2016 12:17:29 EDT Ventricular Rate:  93 PR Interval:  132 QRS Duration: 78 QT Interval:  354 QTC Calculation: 440 R Axis:   86 Text Interpretation:  Normal sinus rhythm Biatrial enlargement Otherwise normal ECG No previous tracing Confirmed by KNOTT MD, DANIEL [phone]([id number]) on 04/30/2016 1:58:26 PM       Radiology Dg Chest 2 View  Result Date: 04/30/2016 CLINICAL DATA:  Chest pain since yesterday EXAM: CHEST  2 VIEW COMPARISON:  None. FINDINGS: The heart size and mediastinal contours are within normal  limits. Both lungs are clear. The visualized skeletal structures are unremarkable. IMPRESSION: No active cardiopulmonary disease. Electronically Signed   By: Charlett NoseKevin  Dover M.D.   On: 04/30/2016 13:26    Procedures Procedures (including critical care time)  Medications Ordered in ED Medications  ibuprofen (ADVIL,MOTRIN) 100 MG/5ML suspension 400 mg (400 mg Oral Given 04/30/16 1432)     Initial Impression / Assessment and Plan / ED Course  I have reviewed the triage vital signs and the nursing notes.  Pertinent labs & imaging results that were available during my care of the patient were reviewed by me and considered in my medical decision making (see chart for details).  Clinical Course  Value Comment By Time  ED EKG within 10 minutes (Reviewed) Elson AreasLeslie K Gaven Eugene, PA-C 10/03 1357    Pt left before results returned.  I doubt cardiac etiology.   Final Clinical Impressions(s) / ED Diagnoses   Final diagnoses:  Chest wall pain    New Prescriptions Discharge Medication List as of 04/30/2016  3:07 PM       Elson AreasLeslie K Rishav Rockefeller, PA-C 04/30/16 1531    Lonia SkinnerLeslie K [place]ofia, PA-C 04/30/16 [address]1532    Jesson Foskey K [place], [place]PA-C 04/30/16 1532    Lyndal Pulleyaniel Knott, MD 04/30/16 [phone]

## 2016-04-30 NOTE — ED Triage Notes (Signed)
Pt states she has been having CP  In her left Chest since yesterday. Pt took asa with no relief. Pt states she also took ginger to help but this has not provided relief. Pt states it hurts with deep inspiration and with movement. Pt denies n/v.

## 2016-04-30 NOTE — ED Notes (Signed)
Called patient for hourly rounding, no response

## 2016-04-30 NOTE — ED Notes (Signed)
Pt leaves AMA and refuses to sign AMA paperwork.

## 2016-12-06 ENCOUNTER — Encounter (HOSPITAL_COMMUNITY): Payer: Self-pay | Admitting: Emergency Medicine

## 2016-12-06 ENCOUNTER — Emergency Department (HOSPITAL_COMMUNITY)
Admission: EM | Admit: 2016-12-06 | Discharge: 2016-12-06 | Disposition: A | Discharge status: Home / Self Care | Payer: Medicaid Other | Attending: Emergency Medicine | Admitting: Emergency Medicine

## 2016-12-06 DIAGNOSIS — F17210 Nicotine dependence, cigarettes, uncomplicated: Secondary | ICD-10-CM | POA: Insufficient documentation

## 2016-12-06 DIAGNOSIS — R6889 Other general symptoms and signs: Secondary | ICD-10-CM

## 2016-12-06 DIAGNOSIS — B349 Viral infection, unspecified: Secondary | ICD-10-CM | POA: Insufficient documentation

## 2016-12-06 DIAGNOSIS — Z79899 Other long term (current) drug therapy: Secondary | ICD-10-CM | POA: Insufficient documentation

## 2016-12-06 MED ORDER — IBUPROFEN 600 MG PO TABS: 600.0000 mg | ORAL_TABLET | Freq: Four times a day (QID) | ORAL | 0 refills | Status: DC | PRN

## 2016-12-06 NOTE — ED Notes (Signed)
Signature pad not working. 

## 2016-12-06 NOTE — ED Notes (Signed)
Pt reports generalized body aches and cough.  Pt in NAD, is A&Ox 4.

## 2016-12-06 NOTE — ED Triage Notes (Signed)
Pt reports generalized tiredness, cough, sneezing, and HA since yesterday.

## 2016-12-06 NOTE — ED Provider Notes (Signed)
[facility]-EMERGENCY DEPT Provider Note   CSN: [id number] Arrival date & time: 12/06/16  0810     History   Chief Complaint Chief Complaint  Patient presents with  . Generalized Body Aches  . Cough    HPI Michele Higgins is a 31 y.o. female.  HPI Onset of symptoms for 1 day. Chills and sweats. Patient reports generalized body. Dry cough without chest pain or shortness of breath. Clear thin nasal congestion and drainage. No sore throat and earache. No abdominal pain. No nausea no vomiting. No skin rashes. Patient does indoor work. Denies exposure to ticks her mosquito bites. Denies HIV risk factors. Has tried Claritin and nasal decongestant with minimal relief. History reviewed. No pertinent past medical history.  There are no active problems to display for this patient.   History reviewed. No pertinent surgical history.  OB History    No data available       Home Medications    Prior to Admission medications   Medication Sig Start Date End Date Taking? Authorizing Provider  etonogestrel (NEXPLANON) 68 MG IMPL implant 1 each by Subdermal route once.    [provider]  ibuprofen (ADVIL,MOTRIN) 600 MG tablet Take 1 tablet (600 mg total) by mouth every 6 (six) hours as needed. 12/06/16   Arby Barrette, MD  ketorolac (TORADOL) 10 MG tablet Take 1 tablet (10 mg total) by mouth every 6 (six) hours as needed for pain. Patient not taking: Reported on 04/30/2016 11/06/12   Linna Hoff, MD  naproxen (NAPROSYN) 500 MG tablet Take 1 tablet (500 mg total) by mouth 2 (two) times daily with a meal. Patient not taking: Reported on 04/30/2016 10/09/12   Arthor Captain, PA-C  ondansetron (ZOFRAN) 4 MG tablet Take 1 tablet (4 mg total) by mouth every 6 (six) hours. Patient not taking: Reported on 04/30/2016 11/06/12   Linna Hoff, MD    Family History History reviewed. No pertinent family history.  Social History Social History  Substance Use Topics  . Smoking status: Current  Every Day Smoker    Packs/day: 0.50    Types: Cigarettes  . Smokeless tobacco: Never Used  . Alcohol use Yes     Comment: occasional     Allergies   Other   Review of Systems Review of Systems 10 Systems reviewed and are negative for acute change except as noted in the HPI.   Physical Exam Updated Vital Signs BP 114/76   Pulse 84   Temp 98.3 F (36.8 C) (Oral)   Resp 16   SpO2 100%   Physical Exam  Constitutional: She is oriented to person, place, and time. She appears well-developed and well-nourished. No distress.  HENT:  Head: Normocephalic and atraumatic.  Bilateral TMs normal. Nares patent without bogginess or drainage. Posterior oropharynx widely patent no erythema or exudate. Dentition good condition.  Eyes: Conjunctivae and EOM are normal. Pupils are equal, round, and reactive to light.  Neck: Neck supple. No thyromegaly present.  Cardiovascular: Normal rate and regular rhythm.   No murmur heard. Pulmonary/Chest: Effort normal and breath sounds normal. No respiratory distress.  Abdominal: Soft. There is no tenderness.  Musculoskeletal: She exhibits no edema.  Lymphadenopathy:    She has no cervical adenopathy.  Neurological: She is alert and oriented to person, place, and time. No cranial nerve deficit. She exhibits normal muscle tone. Coordination normal.  Skin: Skin is warm and dry.  Psychiatric: She has a normal mood and affect.  Nursing note and vitals  reviewed.    ED Treatments / Results  Labs (all labs ordered are listed, but only abnormal results are displayed) Labs Reviewed - No data to display  EKG  EKG Interpretation None       Radiology No results found.  Procedures Procedures (including critical care time)  Medications Ordered in ED Medications - No data to display   Initial Impression / Assessment and Plan / ED Course  I have reviewed the triage vital signs and the nursing notes.  Pertinent labs & imaging results that were  available during my care of the patient were reviewed by me and considered in my medical decision making (see chart for details).     Final Clinical Impressions(s) / ED Diagnoses   Final diagnoses:  Flu-like symptoms  Viral syndrome  Patient clinically well and appearance with symptoms consistent with viral syndrome. No medical history and no apparent risk factors for Zoonosis or bacterial infection.  New Prescriptions New Prescriptions   IBUPROFEN (ADVIL,MOTRIN) 600 MG TABLET    Take 1 tablet (600 mg total) by mouth every 6 (six) hours as needed.     Arby BarrettePfeiffer, Carley Glendenning, MD 12/06/16 [phone]

## 2017-07-25 ENCOUNTER — Emergency Department (HOSPITAL_COMMUNITY)
Admission: EM | Admit: 2017-07-25 | Discharge: 2017-07-25 | Disposition: A | Discharge status: Home / Self Care | Payer: Self-pay | Attending: Emergency Medicine | Admitting: Emergency Medicine

## 2017-07-25 ENCOUNTER — Encounter (HOSPITAL_COMMUNITY): Payer: Self-pay | Admitting: Emergency Medicine

## 2017-07-25 DIAGNOSIS — L0231 Cutaneous abscess of buttock: Secondary | ICD-10-CM | POA: Insufficient documentation

## 2017-07-25 DIAGNOSIS — F17210 Nicotine dependence, cigarettes, uncomplicated: Secondary | ICD-10-CM | POA: Insufficient documentation

## 2017-07-25 MED ORDER — LIDOCAINE-EPINEPHRINE (PF) 2 %-1:200000 IJ SOLN
20.0000 mL | Freq: Once | INTRAMUSCULAR | Status: AC
  Administered 2017-07-25: 20 mL
  Filled 2017-07-25: qty 20

## 2017-07-25 MED ORDER — IBUPROFEN 600 MG PO TABS: 600.0000 mg | ORAL_TABLET | Freq: Four times a day (QID) | ORAL | 0 refills | Status: DC | PRN

## 2017-07-25 MED ORDER — DOXYCYCLINE HYCLATE 100 MG PO CAPS: 100.0000 mg | ORAL_CAPSULE | Freq: Two times a day (BID) | ORAL | 0 refills | Status: DC

## 2017-07-25 NOTE — ED Provider Notes (Signed)
[facility]-EMERGENCY DEPT Provider Note   CSN: [id number] Arrival date & time: 07/25/17  1040     History   Chief Complaint Chief Complaint  Patient presents with  . Abscess    L buttock    HPI Michele Higgins is a 31 y.o. female.  HPI   31 year old female presenting for evaluation of skin infection.  Patient report for more than 1 week she has had a lump on her left buttock that is painful to the touch and rates pain as sharp throbbing 10 out of 10 worse with palpation or sitting on it.  Lump felt similar to prior abscess that she had in the past which required incision and drainage.  She has been using warm shower for some relief.  She does not like to take pills.  She denies having fever chills rectal pain or having trouble with bowel movement.  She is up-to-date with tetanus.  History reviewed. No pertinent past medical history.  There are no active problems to display for this patient.   History reviewed. No pertinent surgical history.  OB History    No data available       Home Medications    Prior to Admission medications   Medication Sig Start Date End Date Taking? Authorizing Provider  Aspirin-Acetaminophen-Caffeine (GOODYS EXTRA STRENGTH) [phone] [id number] MG PACK Take 1 packet by mouth daily as needed (MENSTRUAL PAIN).   Yes [provider]  ibuprofen (ADVIL,MOTRIN) 600 MG tablet Take 1 tablet (600 mg total) by mouth every 6 (six) hours as needed. Patient not taking: Reported on 07/25/2017 12/06/16   Arby BarrettePfeiffer, Marcy, MD  ketorolac (TORADOL) 10 MG tablet Take 1 tablet (10 mg total) by mouth every 6 (six) hours as needed for pain. Patient not taking: Reported on 04/30/2016 11/06/12   Linna HoffKindl, James D, MD  naproxen (NAPROSYN) 500 MG tablet Take 1 tablet (500 mg total) by mouth 2 (two) times daily with a meal. Patient not taking: Reported on 04/30/2016 10/09/12   Arthor CaptainHarris, Abigail, PA-C  ondansetron (ZOFRAN) 4 MG tablet Take 1 tablet (4 mg total)  by mouth every 6 (six) hours. Patient not taking: Reported on 04/30/2016 11/06/12   Linna HoffKindl, James D, MD    Family History History reviewed. No pertinent family history.  Social History Social History   Tobacco Use  . Smoking status: Current Every Day Smoker    Packs/day: 0.50    Types: Cigarettes  . Smokeless tobacco: Never Used  Substance Use Topics  . Alcohol use: Yes    Comment: occasional  . Drug use: Yes    Types: Marijuana     Allergies   Other   Review of Systems Review of Systems  All other systems reviewed and are negative.    Physical Exam Updated Vital Signs BP (!) 139/95   Pulse 90   Temp 98.4 F (36.9 C)   Resp 16   LMP 07/19/2017 (Exact Date)   SpO2 100%   Physical Exam  Constitutional: She appears well-developed and well-nourished. No distress.  HENT:  Head: Atraumatic.  Eyes: Conjunctivae are normal.  Neck: Neck supple.  Genitourinary:  Genitourinary Comments: Chaperone present during exam.  Left buttock with an area of induration and fluctuance measuring approximately 1 x 2 cm to the medial aspect of buttocks without any rectal involvement.  No surrounding skin erythema.  Area is exquisitely tender to palpation.  Neurological: She is alert.  Skin: No rash noted.  Psychiatric: She has a normal mood and affect.  Nursing note and vitals reviewed.    ED Treatments / Results  Labs (all labs ordered are listed, but only abnormal results are displayed) Labs Reviewed - No data to display  EKG  EKG Interpretation None       Radiology No results found.  Procedures .Marland Kitchen.Incision and Drainage Date/Time: 07/25/2017 12:20 PM Performed by: Fayrene Helperran, Ewin Rehberg, PA-C Authorized by: Fayrene Helperran, Caz Weaver, PA-C   Consent:    Consent obtained:  Verbal   Consent given by:  Patient   Risks discussed:  Incomplete drainage and pain   Alternatives discussed:  No treatment Location:    Type:  Abscess   Size:  1x2cm   Location:  Anogenital   Anogenital location:  left buttock, medial. Pre-procedure details:    Skin preparation:  Betadine Anesthesia (see MAR for exact dosages):    Anesthesia method:  Local infiltration   Local anesthetic:  Lidocaine 2% WITH epi Procedure type:    Complexity:  Simple Procedure details:    Incision types:  Stab incision   Incision depth:  Subcutaneous   Scalpel blade:  11   Wound management:  Probed and deloculated   Drainage:  Purulent   Drainage amount:  Moderate   Wound treatment:  Wound left open   Packing materials:  1/4 in iodoform gauze   Amount 1/4" iodoform:  2" Post-procedure details:    Patient tolerance of procedure:  Tolerated with difficulty   (including critical care time)  Medications Ordered in ED Medications - No data to display   Initial Impression / Assessment and Plan / ED Course  I have reviewed the triage vital signs and the nursing notes.  Pertinent labs & imaging results that were available during my care of the patient were reviewed by me and considered in my medical decision making (see chart for details).     BP (!) 139/95   Pulse 90   Temp 98.4 F (36.9 C)   Resp 16   LMP 07/19/2017 (Exact Date)   SpO2 100%    Final Clinical Impressions(s) / ED Diagnoses   Final diagnoses:  Abscess of buttock, left    ED Discharge Orders    None     12:24 PM L buttock abscess, I performed I&D, and packing placed.  Pt is not pregnant.  Doxy and ibuprofen.  Return in 2 days for wound check and packing removal.    Fayrene Helperran, Anaid Haney, PA-C 07/25/17 1226    Cathren LaineSteinl, Kevin, MD 07/25/17 1537

## 2017-07-25 NOTE — ED Triage Notes (Signed)
Pt with abscess to L buttock. Pt states it started approximately 1 week ago. Pt with difficulty sitting and pain with sitting.

## 2017-07-25 NOTE — Discharge Instructions (Signed)
You have a skin abscess that was incised and drained.  Please continue with warm compress several times daily, and take antibiotic as prescribed.  Return in 2 days for wound packing removal and reassessment of your infection.

## 2017-07-29 DIAGNOSIS — B009 Herpesviral infection, unspecified: Secondary | ICD-10-CM

## 2017-07-29 HISTORY — DX: Herpesviral infection, unspecified: B00.9

## 2017-11-21 ENCOUNTER — Ambulatory Visit (HOSPITAL_COMMUNITY)
Admission: EM | Admit: 2017-11-21 | Discharge: 2017-11-21 | Disposition: A | Discharge status: Home / Self Care | Payer: Self-pay | Attending: Family Medicine | Admitting: Family Medicine

## 2017-11-21 ENCOUNTER — Encounter (HOSPITAL_COMMUNITY): Payer: Self-pay | Admitting: *Deleted

## 2017-11-21 DIAGNOSIS — Z7982 Long term (current) use of aspirin: Secondary | ICD-10-CM | POA: Insufficient documentation

## 2017-11-21 DIAGNOSIS — Z113 Encounter for screening for infections with a predominantly sexual mode of transmission: Secondary | ICD-10-CM | POA: Insufficient documentation

## 2017-11-21 DIAGNOSIS — B001 Herpesviral vesicular dermatitis: Secondary | ICD-10-CM | POA: Insufficient documentation

## 2017-11-21 DIAGNOSIS — F17210 Nicotine dependence, cigarettes, uncomplicated: Secondary | ICD-10-CM | POA: Insufficient documentation

## 2017-11-21 MED ORDER — ACYCLOVIR 400 MG PO TABS: 800.0000 mg | ORAL_TABLET | Freq: Three times a day (TID) | ORAL | 0 refills | Status: AC

## 2017-11-21 NOTE — Discharge Instructions (Signed)
Will notify you of any positive findings and if any changes to treatment are needed.   See provided information in regards to cold sores and herpes simplex type 1

## 2017-11-21 NOTE — ED Triage Notes (Signed)
Patient states that she noticed blister to right upper lid about a week ago. Noticeable swelling after going to the beach. States this has happened 2 other times. Patient would also like testing for STDs to make sure it is not herpes.
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
## 2017-11-22 LAB — HIV ANTIBODY (ROUTINE TESTING W REFLEX): HIV SCREEN 4TH GENERATION: NONREACTIVE

## 2017-11-22 LAB — RPR: RPR Ser Ql: NONREACTIVE

## 2017-11-24 ENCOUNTER — Telehealth (HOSPITAL_COMMUNITY): Payer: Self-pay

## 2017-11-24 LAB — URINE CYTOLOGY ANCILLARY ONLY
CHLAMYDIA, DNA PROBE: NEGATIVE
Neisseria Gonorrhea: NEGATIVE
TRICH (WINDOWPATH): POSITIVE — AB

## 2017-11-24 MED ORDER — METRONIDAZOLE 50 MG/ML ORAL SUSPENSION: 500.0000 mg | Freq: Two times a day (BID) | ORAL | 0 refills | Status: DC

## 2017-11-24 NOTE — Telephone Encounter (Signed)
Trichomonas is positive.  Rx metronidazole  bid x 7d #14 no refills was sent to the pharmacy of record. PT called and made aware.  Educated patient to refrain from sexual intercourse for 7 days to give the medicine time to work.  Sexual partners need to be notified and tested/treated.  Condoms may reduce risk of reinfection.   Recheck for further evaluation if symptoms are not improving. Pt verbalized understanding.  Pt requesting liquid form of medication.

## 2017-11-25 ENCOUNTER — Telehealth (HOSPITAL_COMMUNITY): Payer: Self-pay | Admitting: Emergency Medicine

## 2017-11-25 ENCOUNTER — Telehealth (HOSPITAL_COMMUNITY): Payer: Self-pay

## 2017-11-25 MED ORDER — METRONIDAZOLE 50 MG/ML ORAL SUSPENSION: 500.0000 mg | Freq: Two times a day (BID) | ORAL | 0 refills | Status: DC

## 2017-11-25 MED ORDER — METRONIDAZOLE 50 MG/ML ORAL SUSPENSION: 500.0000 mg | Freq: Two times a day (BID) | ORAL | 0 refills | Status: AC

## 2017-11-25 MED ORDER — METRONIDAZOLE 500 MG PO TABS: 2000.0000 mg | ORAL_TABLET | Freq: Once | ORAL | 0 refills | Status: AC

## 2017-11-25 NOTE — Telephone Encounter (Signed)
Pharmacy does not have oral suspension patient requested, consulted with Dr. Chaney Malling who advised to prescribe patient one time dose of 2000 mg of Flagyl and have the patient crush up the pills into apple sauce or juice since she cannot swallow pills.

## 2017-11-26 LAB — URINE CYTOLOGY ANCILLARY ONLY: CANDIDA VAGINITIS: NEGATIVE

## 2017-11-27 NOTE — Progress Notes (Signed)
Bacterial Vaginosis test is positive.  Prescription for metronidazole was given already for treatment of Trichomonas.

## 2018-02-23 IMAGING — DX DG CHEST 2V
2 series · 2 of 2 positions shown · non-contrast
Comparison: None.

CLINICAL DATA: Chest pain since yesterday

EXAM:
CHEST  2 VIEW

[w chest pa]
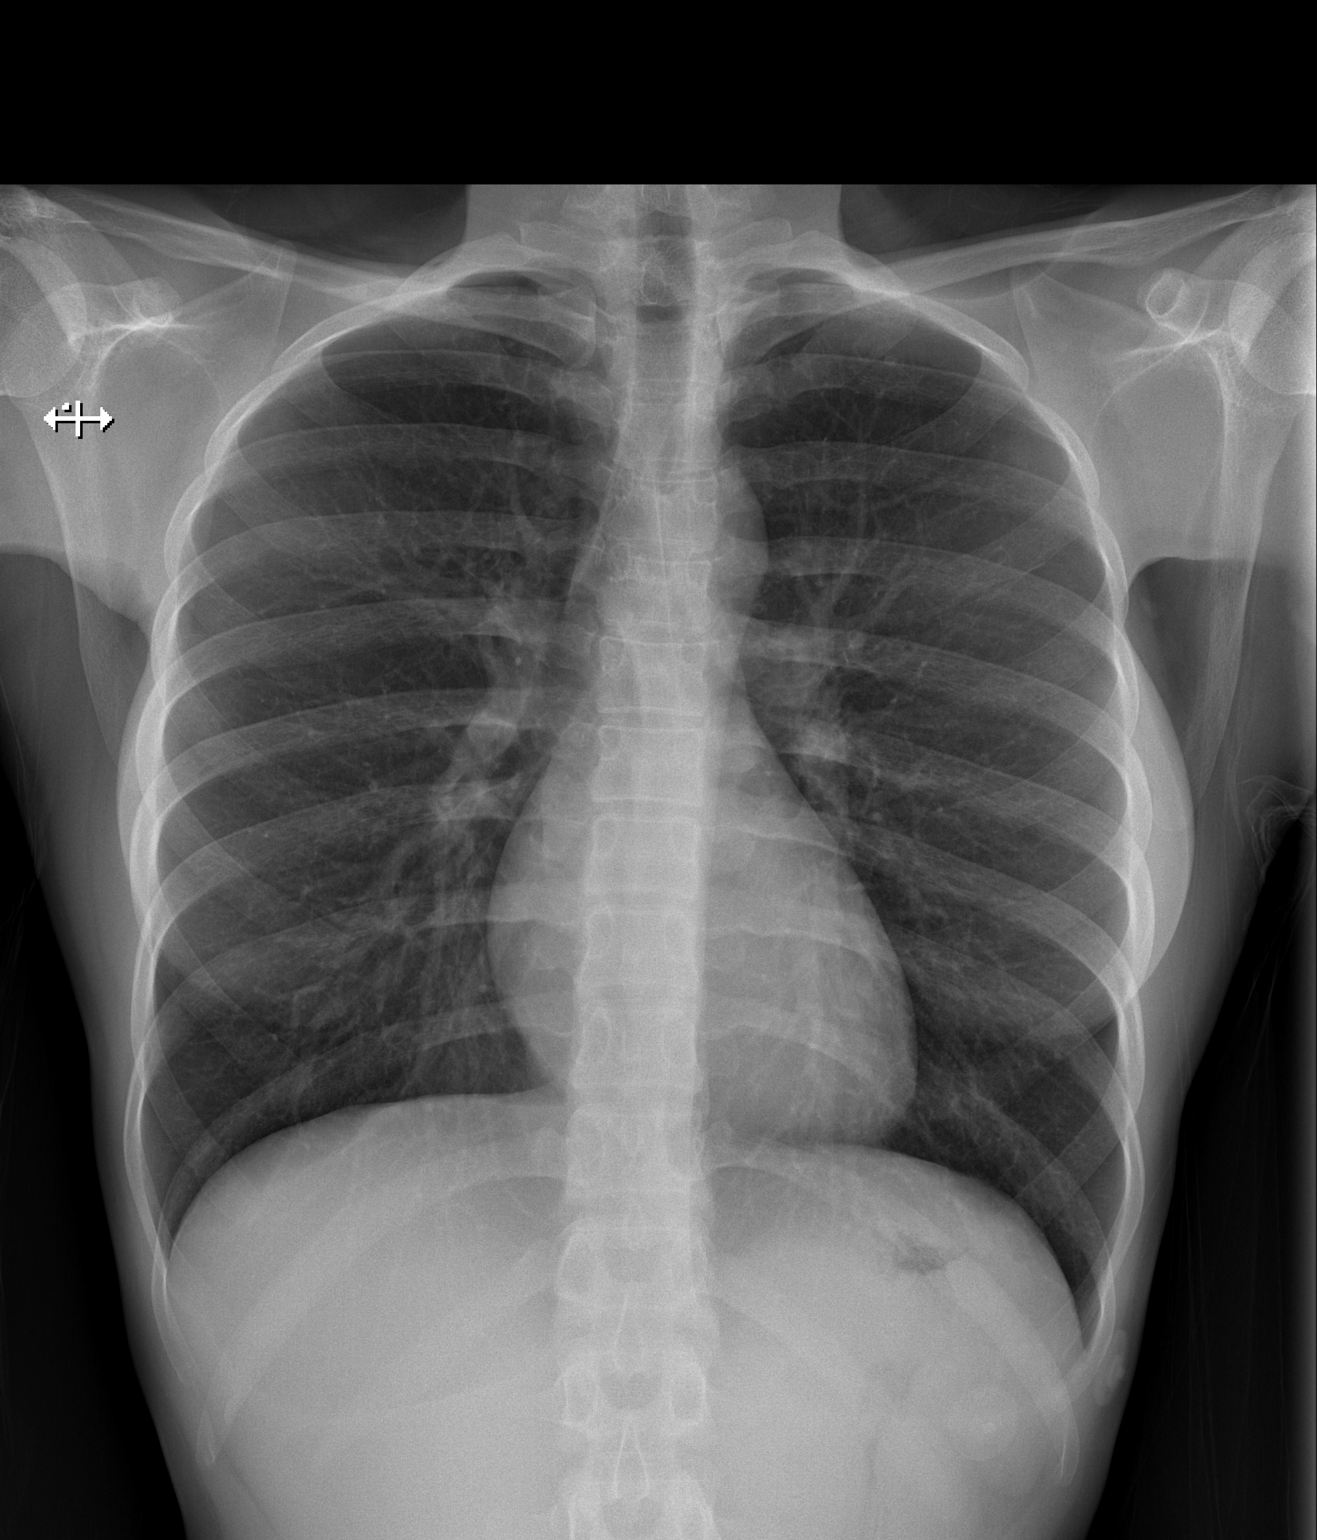

[w chest lat]
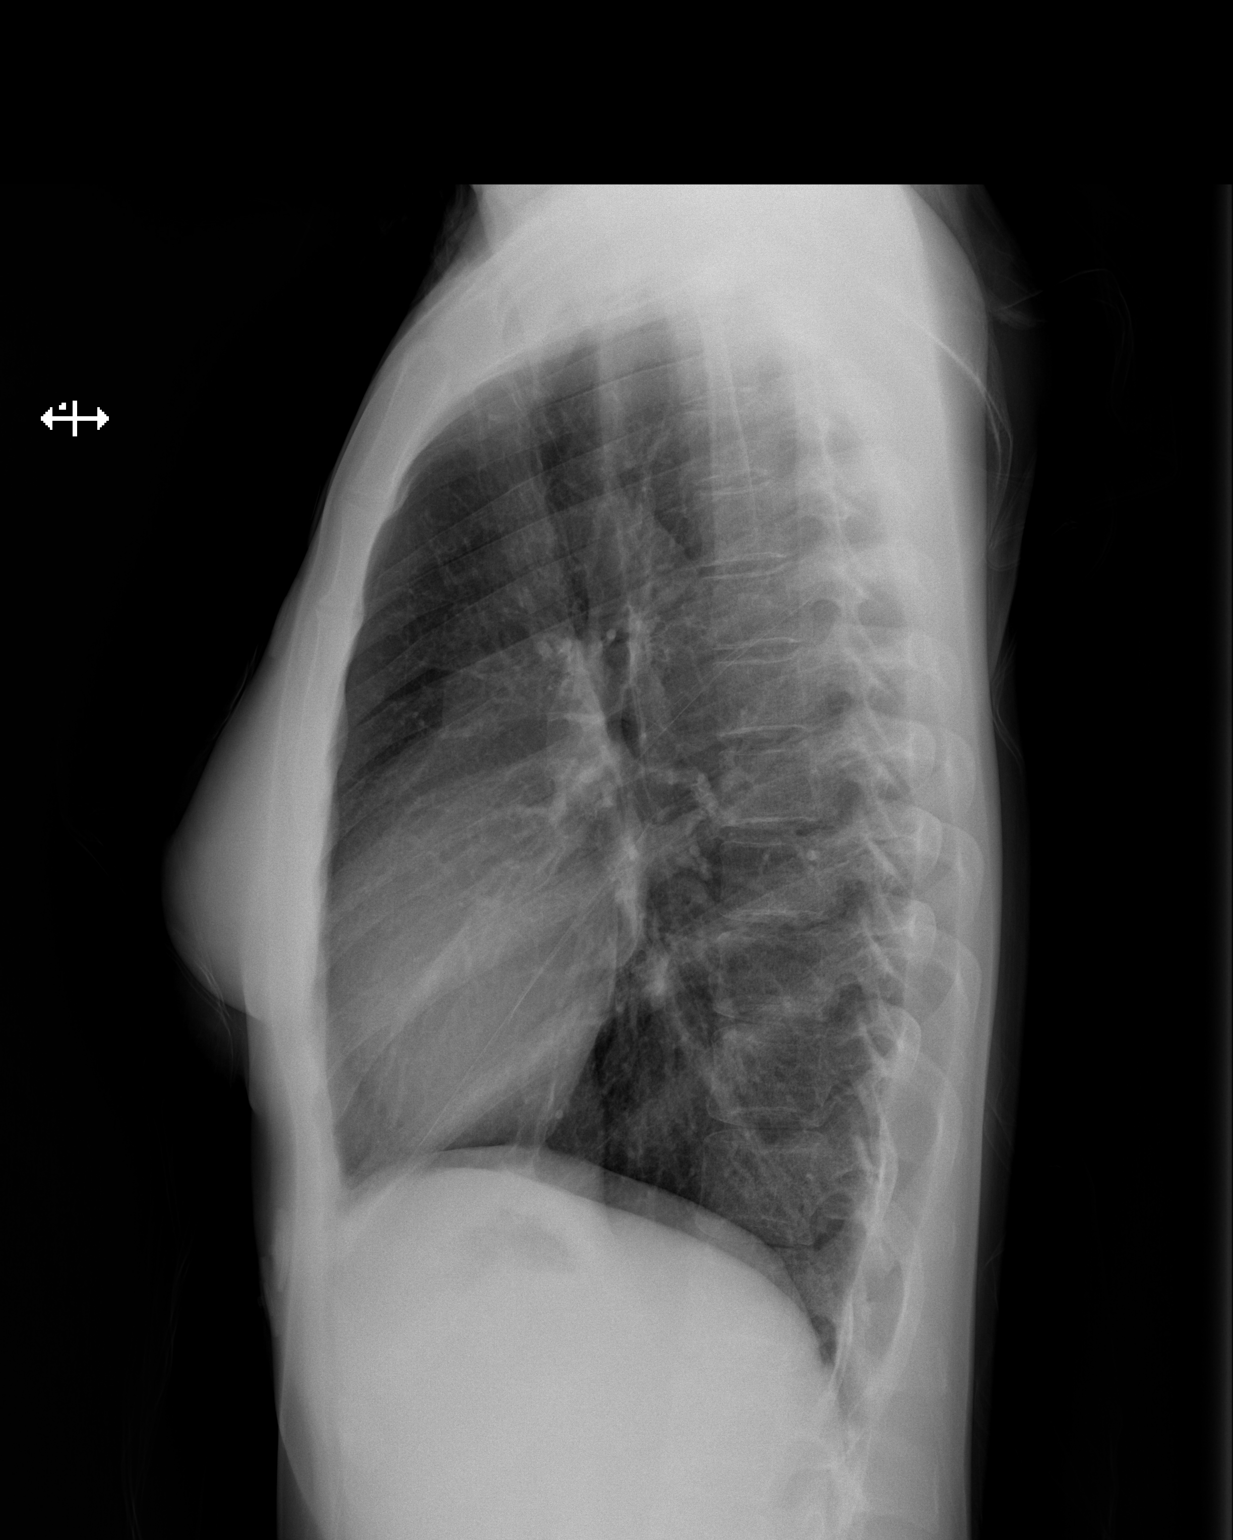

[2 of 2 positions shown; findings below may reference images not displayed]

FINDINGS: The heart size and mediastinal contours are within normal limits.
Both lungs are clear. The visualized skeletal structures are
unremarkable.
IMPRESSION: No active cardiopulmonary disease.

## 2018-03-14 ENCOUNTER — Emergency Department (HOSPITAL_COMMUNITY)
Admission: EM | Admit: 2018-03-14 | Discharge: 2018-03-15 | Disposition: A | Discharge status: Home / Self Care | Payer: Medicaid Other | Attending: Emergency Medicine | Admitting: Emergency Medicine

## 2018-03-14 ENCOUNTER — Other Ambulatory Visit: Payer: Self-pay

## 2018-03-14 ENCOUNTER — Encounter (HOSPITAL_COMMUNITY): Payer: Self-pay

## 2018-03-14 DIAGNOSIS — B9689 Other specified bacterial agents as the cause of diseases classified elsewhere: Secondary | ICD-10-CM

## 2018-03-14 DIAGNOSIS — N760 Acute vaginitis: Secondary | ICD-10-CM | POA: Insufficient documentation

## 2018-03-14 DIAGNOSIS — F17210 Nicotine dependence, cigarettes, uncomplicated: Secondary | ICD-10-CM | POA: Insufficient documentation

## 2018-03-14 DIAGNOSIS — Z79899 Other long term (current) drug therapy: Secondary | ICD-10-CM | POA: Insufficient documentation

## 2018-03-14 DIAGNOSIS — B373 Candidiasis of vulva and vagina: Secondary | ICD-10-CM

## 2018-03-14 DIAGNOSIS — B3731 Acute candidiasis of vulva and vagina: Secondary | ICD-10-CM

## 2018-03-14 LAB — URINALYSIS, ROUTINE W REFLEX MICROSCOPIC
BILIRUBIN URINE: NEGATIVE
Bacteria, UA: NONE SEEN
Glucose, UA: NEGATIVE mg/dL
HGB URINE DIPSTICK: NEGATIVE
KETONES UR: NEGATIVE mg/dL
NITRITE: NEGATIVE
Protein, ur: NEGATIVE mg/dL
Specific Gravity, Urine: 1.014 (ref 1.005–1.030)
pH: 6 (ref 5.0–8.0)

## 2018-03-14 LAB — WET PREP, GENITAL
Sperm: NONE SEEN
Trich, Wet Prep: NONE SEEN

## 2018-03-14 LAB — POC URINE PREG, ED: Preg Test, Ur: NEGATIVE

## 2018-03-14 MED ORDER — FLUCONAZOLE 200 MG PO TABS: 200.0000 mg | ORAL_TABLET | Freq: Every day | ORAL | 0 refills | Status: AC

## 2018-03-14 MED ORDER — FENTANYL CITRATE (PF) 100 MCG/2ML IJ SOLN
50.0000 ug | Freq: Once | INTRAMUSCULAR | Status: AC
Start: 2018-03-14 — End: 2018-03-14
  Administered 2018-03-14: 50 ug via INTRAMUSCULAR
  Filled 2018-03-14: qty 2

## 2018-03-14 MED ORDER — METRONIDAZOLE 500 MG PO TABS: 500.0000 mg | ORAL_TABLET | Freq: Two times a day (BID) | ORAL | 0 refills | Status: DC

## 2018-03-14 MED ORDER — HYDROCODONE-ACETAMINOPHEN 5-325 MG PO TABS
2.0000 | ORAL_TABLET | Freq: Once | ORAL | Status: DC
Start: 2018-03-14 — End: 2018-03-15
  Filled 2018-03-14: qty 2

## 2018-03-14 MED ORDER — LIDOCAINE 2 % EX GEL: 1.0000 "application " | Freq: Two times a day (BID) | CUTANEOUS | 0 refills | Status: DC

## 2018-03-14 NOTE — ED Triage Notes (Signed)
PT presents to ED from home for vaginal itching. Pt reports that it started 3 days ago when her period ended. Pt tried vagisil at home.

## 2018-03-14 NOTE — Discharge Instructions (Signed)
Take Flagyl as directed.  It is very important that you do not consume any alcohol while taking this medication as it will cause you to become violently ill.  Do not have any sexual intercourse until he finished antibiotics.  Today you were tested for gonorrhea and chlamydia.  Those results did not come back for 2 days.  You will be notified if they are positive.  Take Diflucan as directed.   Use lidocaine as directed. Do not put it in the vagina. Do not use it for more than 7 days.  Follow-up with OB/GYN as directed.  Return the emergency room for any worsening pain, vaginal bleeding, fevers or any other worsening or concerning symptoms.

## 2018-03-14 NOTE — ED Notes (Signed)
Pt. Stated she has difficulty of swallowing pills since childhood. Preferred to have liquid form of pain med. Will notify PA.

## 2018-03-14 NOTE — ED Provider Notes (Signed)
[facility]-EMERGENCY DEPT Provider Note   CSN: [id number] Arrival date & time: 03/14/18  1956     History   Chief Complaint Chief Complaint  Patient presents with  . Vaginal Itching    HPI Michele Higgins is a 32 y.o. female who presents for evaluation of vaginal discomfort that began 3 days ago.  Patient reports that symptoms initially began after she stopped her menstrual cycle.  She states that she was having vaginal itching.  She states that now it is more of a burning sensation and is constantly irritating.  She states that she has had some brownish discharge but denies any vaginal bleeding.  Patient states that she has a history of herpes and trichomonas few months ago.  Denies any other STDs.  Patient states that she is currently sexually active with one female partner.  They do not use any protection.  Patient denies any dysuria, hematuria, vaginal bleeding.  The history is provided by the patient.    History reviewed. No pertinent past medical history.  There are no active problems to display for this patient.   History reviewed. No pertinent surgical history.   OB History   None      Home Medications    Prior to Admission medications   Medication Sig Start Date End Date Taking? Authorizing Provider  Aspirin-Acetaminophen-Caffeine (GOODYS EXTRA STRENGTH) [phone] MG PACK Take 1 packet by mouth daily as needed (MENSTRUAL PAIN).    [provider]  doxycycline (VIBRAMYCIN) 100 MG capsule Take 1 capsule (100 mg total) by mouth 2 (two) times daily. One po bid x 7 days 07/25/17   Fayrene Helperran, [place], PA-C  fluconazole (DIFLUCAN) 200 MG tablet Take 1 tablet (200 mg total) by mouth daily for 2 days. 03/14/18 03/16/18  Maxwell CaulLayden, Akari Crysler A, PA-C  ibuprofen (ADVIL,MOTRIN) 600 MG tablet Take 1 tablet (600 mg total) by mouth every 6 (six) hours as needed for moderate pain. 07/25/17   Fayrene Helperran, [place], PA-C  Lidocaine 2 % GEL Apply 1 application topically 2 (two)  times daily. 03/14/18   Maxwell CaulLayden, Daizy Outen A, PA-C  metroNIDAZOLE (FLAGYL) 500 MG tablet Take 1 tablet (500 mg total) by mouth 2 (two) times daily. 03/14/18   Maxwell CaulLayden, Jahmar Mckelvy A, PA-C    Family History History reviewed. No pertinent family history.  Social History Social History   Tobacco Use  . Smoking status: Current Every Day Smoker    Packs/day: 0.50    Types: Cigarettes  . Smokeless tobacco: Never Used  Substance Use Topics  . Alcohol use: Yes    Comment: occasional  . Drug use: Yes    Types: Marijuana     Allergies   Other   Review of Systems Review of Systems  Gastrointestinal: Negative for abdominal pain.  Genitourinary: Positive for vaginal discharge. Negative for dysuria, hematuria, vaginal bleeding and vaginal pain.       Vaginal itching  All other systems reviewed and are negative.    Physical Exam Updated Vital Signs BP 118/81 (BP Location: Right Arm) Comment: Simultaneous filing. User may not have seen previous data.  Pulse 73 Comment: Simultaneous filing. User may not have seen previous data.  Temp 98.1 F (36.7 C) (Oral)   Resp 16   Ht 5\' 8"  (1.727 m)   Wt 49.4 kg   SpO2 100% Comment: Simultaneous filing. User may not have seen previous data.  BMI 16.54 kg/m   Physical Exam  Constitutional: She appears well-developed and well-nourished.  HENT:  Head: Normocephalic  and atraumatic.  Eyes: Conjunctivae and EOM are normal. Right eye exhibits no discharge. Left eye exhibits no discharge. No scleral icterus.  Pulmonary/Chest: Effort normal.  Genitourinary: Uterus normal. Cervix exhibits no motion tenderness, no discharge and no friability. Right adnexum displays no mass and no tenderness. Left adnexum displays no mass and no tenderness. Vaginal discharge found.  Genitourinary Comments: The exam was performed with a chaperone present. Normal external female genitalia. No lesions, rash, or sores.  Tenderness palpation noted to the labia minora bilaterally.   Mild erythema noted to the mucosal lining.  No overlying warmth, erythema, area of fluctuance.  No evidence of Bartholin's abscess. Patient with white discharge in the vaginal vault.  Patient has some pain with insertion of the speculum initially.  No CMT.  No adnexal mass or tenderness bilaterally.  Neurological: She is alert.  Skin: Skin is warm and dry.  Psychiatric: She has a normal mood and affect. Her speech is normal and behavior is normal.  Nursing note and vitals reviewed.    ED Treatments / Results  Labs (all labs ordered are listed, but only abnormal results are displayed) Labs Reviewed  WET PREP, GENITAL - Abnormal; Notable for the following components:      Result Value   Yeast Wet Prep HPF POC PRESENT (*)    Clue Cells Wet Prep HPF POC PRESENT (*)    WBC, Wet Prep HPF POC MANY (*)    All other components within normal limits  URINALYSIS, ROUTINE W REFLEX MICROSCOPIC - Abnormal; Notable for the following components:   Leukocytes, UA MODERATE (*)    All other components within normal limits  POC URINE PREG, ED  GC/CHLAMYDIA PROBE AMP ([place]) NOT AT [facility]    EKG None  Radiology No results found.  Procedures Procedures (including critical care time)  Medications Ordered in ED Medications  HYDROcodone-acetaminophen (NORCO/VICODIN) 5-325 MG per tablet 2 tablet (has no administration in time range)  fentaNYL (SUBLIMAZE) injection 50 mcg (50 mcg Intramuscular Given 03/14/18 2302)     Initial Impression / Assessment and Plan / ED Course  I have reviewed the triage vital signs and the nursing notes.  Pertinent labs & imaging results that were available during my care of the patient were reviewed by me and considered in my medical decision making (see chart for details).     32 year old female who presents for evaluation of vaginal irritation for the last 3 days.  Patient reports that she has had some brownish vaginal discharge.  Denies any vaginal bleeding.   Currently sexually active with one female partner.  Does report a history of trichomonas and oral herpes.  No urinary complaints. Patient is afebrile, non-toxic appearing, sitting comfortably on examination table. Vital signs reviewed and stable.  Consider STDs versus adenitis versus abscess.  Will plan for pelvic and UA.  UA shows moderate leukocytes.  No other acute infectious etiology.  Urine pregnancy negative.  Pelvic exam as documented above.  Patient had some initial pain with insertion of speculum but no CMT.  Exam is not concerning for PID.  Patient did have some white vaginal discharge in the vaginal vault.  No adnexal mass or tenderness noted bilaterally.  No evidence of Bartholin's abscess.  Wet prep shows yeast, clue cells.  We will plan to treat for BV and yeast.  Discussed results with patient.  Encourage pelvic rest.  Instructed patient follow-up with her OB/GYN for further evaluation.  Patient instructed on supportive at home therapies. Patient had  ample opportunity for questions and discussion. All patient's questions were answered with full understanding. Strict return precautions discussed. Patient expresses understanding and agreement to plan.   Final Clinical Impressions(s) / ED Diagnoses   Final diagnoses:  BV (bacterial vaginosis)  Vaginal yeast infection    ED Discharge Orders         Ordered    metroNIDAZOLE (FLAGYL) 500 MG tablet  2 times daily,   Status:  Discontinued     03/14/18 2333    fluconazole (DIFLUCAN) 200 MG tablet  Daily     03/14/18 2333    Lidocaine 2 % GEL  2 times daily     03/14/18 2333    metroNIDAZOLE (FLAGYL) 500 MG tablet  2 times daily     03/14/18 2334           Rosana HoesLayden, Mora Pedraza A, PA-C 03/14/18 2358    Gerhard MunchLockwood, Robert, MD 03/15/18 [phone]

## 2018-03-16 LAB — GC/CHLAMYDIA PROBE AMP (~~LOC~~) NOT AT ARMC
Chlamydia: NEGATIVE
NEISSERIA GONORRHEA: NEGATIVE

## 2020-03-11 ENCOUNTER — Emergency Department (HOSPITAL_COMMUNITY)
Admission: EM | Admit: 2020-03-11 | Discharge: 2020-03-11 | Disposition: A | Discharge status: Home / Self Care | Payer: Medicaid Other | Attending: Emergency Medicine | Admitting: Emergency Medicine

## 2020-03-11 DIAGNOSIS — Z5321 Procedure and treatment not carried out due to patient leaving prior to being seen by health care provider: Secondary | ICD-10-CM | POA: Insufficient documentation

## 2020-03-12 ENCOUNTER — Ambulatory Visit (HOSPITAL_COMMUNITY)
Admission: EM | Admit: 2020-03-12 | Discharge: 2020-03-12 | Disposition: A | Discharge status: Home / Self Care | Payer: Medicaid Other | Attending: Physician Assistant | Admitting: Physician Assistant

## 2020-03-12 ENCOUNTER — Encounter (HOSPITAL_COMMUNITY): Payer: Self-pay

## 2020-03-12 ENCOUNTER — Other Ambulatory Visit: Payer: Self-pay

## 2020-03-12 DIAGNOSIS — K047 Periapical abscess without sinus: Secondary | ICD-10-CM

## 2020-03-12 MED ORDER — AMOXICILLIN 250 MG/5ML PO SUSR: 500.0000 mg | Freq: Three times a day (TID) | ORAL | 0 refills | Status: AC

## 2020-03-12 NOTE — ED Provider Notes (Signed)
[facility]    CSN: [id number] Arrival date & time: 03/12/20  1001      History   Chief Complaint Chief Complaint  Patient presents with  . Dental Pain    HPI Michele Higgins is a 34 y.o. female.   Pt complains of dental pain that started about two days ago, right lower molar around a crown.  She denies fever, chills, drainage.  She has tried goody powder and orajel with no relief.  She does not have a dentist currently.  She has poor dentition with a cracked central incisor.       History reviewed. No pertinent past medical history.  There are no problems to display for this patient.   History reviewed. No pertinent surgical history.  OB History   No obstetric history on file.      Home Medications    Prior to Admission medications   Medication Sig Start Date End Date Taking? Authorizing Provider  Aspirin-Acetaminophen-Caffeine (GOODYS EXTRA STRENGTH) [phone] MG PACK Take 1 packet by mouth daily as needed (MENSTRUAL PAIN).    [provider]  doxycycline (VIBRAMYCIN) 100 MG capsule Take 1 capsule (100 mg total) by mouth 2 (two) times daily. One po bid x 7 days 07/25/17   Fayrene Helper, PA-C  ibuprofen (ADVIL,MOTRIN) 600 MG tablet Take 1 tablet (600 mg total) by mouth every 6 (six) hours as needed for moderate pain. 07/25/17   Fayrene Helper, PA-C  Lidocaine 2 % GEL Apply 1 application topically 2 (two) times daily. 03/14/18   Maxwell Caul, PA-C  metroNIDAZOLE (FLAGYL) 500 MG tablet Take 1 tablet (500 mg total) by mouth 2 (two) times daily. 03/14/18   Maxwell Caul, PA-C    Family History No family history on file.  Social History Social History   Tobacco Use  . Smoking status: Current Every Day Smoker    Packs/day: 0.50    Types: Cigarettes  . Smokeless tobacco: Never Used  Substance Use Topics  . Alcohol use: Yes    Comment: occasional  . Drug use: Yes    Types: Marijuana     Allergies   Other   Review of  Systems Review of Systems  Constitutional: Negative for chills and fever.  HENT: Positive for dental problem. Negative for drooling, ear pain, mouth sores, sore throat and trouble swallowing.   Eyes: Negative for pain and visual disturbance.  Respiratory: Negative for cough and shortness of breath.   Cardiovascular: Negative for chest pain and palpitations.  Gastrointestinal: Negative for abdominal pain and vomiting.  Genitourinary: Negative for dysuria and hematuria.  Musculoskeletal: Negative for arthralgias and back pain.  Skin: Negative for color change and rash.  Neurological: Negative for seizures and syncope.  All other systems reviewed and are negative.    Physical Exam Triage Vital Signs ED Triage Vitals [03/12/20 1020]  Enc Vitals Group     BP 123/74     Pulse Rate 86     Resp 16     Temp 98.3 F (36.8 C)     Temp Source Oral     SpO2 100 %     Weight 115 lb (52.2 kg)     Height 5\' 7"  (1.702 m)     Head Circumference      Peak Flow      Pain Score 10     Pain Loc      Pain Edu?      Excl. in GC?    No  data found.  Updated Vital Signs BP 123/74   Pulse 86   Temp 98.3 F (36.8 C) (Oral)   Resp 16   Ht 5\' 7"  (1.702 m)   Wt 115 lb (52.2 kg)   SpO2 100%   BMI 18.01 kg/m   Visual Acuity Right Eye Distance:   Left Eye Distance:   Bilateral Distance:    Right Eye Near:   Left Eye Near:    Bilateral Near:     Physical Exam Vitals and nursing note reviewed.  Constitutional:      General: She is not in acute distress.    Appearance: She is well-developed.  HENT:     Head: Normocephalic and atraumatic.     Mouth/Throat:     Dentition: Abnormal dentition (poor dentition throughout, cracked left central incisor). Dental tenderness, dental caries and dental abscesses (swelling and redness to gums around the left lower first molar) present.     Tongue: No lesions.     Pharynx: No pharyngeal swelling or posterior oropharyngeal erythema.  Eyes:      Conjunctiva/sclera: Conjunctivae normal.  Cardiovascular:     Rate and Rhythm: Normal rate and regular rhythm.     Heart sounds: No murmur heard.   Pulmonary:     Effort: Pulmonary effort is normal. No respiratory distress.     Breath sounds: Normal breath sounds.  Abdominal:     Palpations: Abdomen is soft.     Tenderness: There is no abdominal tenderness.  Musculoskeletal:     Cervical back: Neck supple.  Skin:    General: Skin is warm and dry.  Neurological:     Mental Status: She is alert.      UC Treatments / Results  Labs (all labs ordered are listed, but only abnormal results are displayed) Labs Reviewed - No data to display  EKG   Radiology No results found.  Procedures Procedures (including critical care time)  Medications Ordered in UC Medications - No data to display  Initial Impression / Assessment and Plan / UC Course  I have reviewed the triage vital signs and the nursing notes.  Pertinent labs & imaging results that were available during my care of the patient were reviewed by me and considered in my medical decision making (see chart for details).     Dental abscess to left lower first molar without drainage, crown in place.  Advised pt alternate ibuprofen and Tyelnol for pain.  Amoxicillin sent to pharmacy.  Liquid sent as pt cannot swallow tablets.  List of dentists given for pt to call tomorrow.   Final Clinical Impressions(s) / UC Diagnoses   Final diagnoses:  None   Discharge Instructions   None    ED Prescriptions    None     PDMP not reviewed this encounter.   , PA-C 03/12/20 1104

## 2020-03-12 NOTE — Discharge Instructions (Signed)
Alternate Tylenol and Ibuprofen as needed for pain.   Take antibiotic as prescribed.  Call dentists listed below tomorrow to get an appointment.   [facility] [phone] extension [phone]  Dr. Lawrence Marseilles [phone] [address]  [place] [phone] [address] [facility] [place].  [facility] [phone] extension [phone], [place], [place], [address] First come first serve for the first 10 clients.  May do simple extractions only, no wisdom teeth or surgery.  You may try the second for Thursday of the month starting at 6:30 AM.  [facility] You may call the school to see if they are still helping to provide dental care for emergent cases.

## 2020-03-12 NOTE — ED Triage Notes (Signed)
Pt has throbbing pain in crown on lower jawx2 days. Pt states when she lays down the pain is worse. Pt states having trouble chewing on that side.

## 2020-09-18 ENCOUNTER — Ambulatory Visit
Admission: EM | Admit: 2020-09-18 | Discharge: 2020-09-18 | Disposition: A | Discharge status: Home / Self Care | Payer: Medicaid Other | Attending: Physician Assistant | Admitting: Physician Assistant

## 2020-09-18 ENCOUNTER — Other Ambulatory Visit: Payer: Self-pay

## 2020-09-18 DIAGNOSIS — L209 Atopic dermatitis, unspecified: Secondary | ICD-10-CM

## 2020-09-18 MED ORDER — TRIAMCINOLONE ACETONIDE 0.1 % EX CREA: 1.0000 "application " | TOPICAL_CREAM | Freq: Two times a day (BID) | CUTANEOUS | 0 refills | Status: DC

## 2020-09-18 NOTE — Discharge Instructions (Addendum)
Keep areas clean and dry. No makeup or soaps for now. Apply a very fine amount to areas on face and hands up to 1- 2 weeks at twice a day. Should clear well. Ok to take Zyrtec 5mg  a day (childrens liquid ok, just take 5mg  instead of the 2.5mg ). Return as needed

## 2020-09-18 NOTE — ED Provider Notes (Signed)
[facility]    CSN: [id number] Arrival date & time: 09/18/20  8937      History   Chief Complaint Chief Complaint  Patient presents with  . Rash    HPI Michele Higgins is a 35 y.o. female.   Who presents with a facial rash and occasional hand rash that began last week. The facial rash began after she rubbed her face after applying perfumed lotion. It is tingling and burning and at times pruritic. Her hand rash occurs at times with various use of waters at work. Also pruritic. No history of PsO or eczema. No known seasonal allergies. No pain associated with the rash and no fevers.      History reviewed. No pertinent past medical history.  There are no problems to display for this patient.   History reviewed. No pertinent surgical history.  OB History   No obstetric history on file.      Home Medications    Prior to Admission medications   Not on File    Family History History reviewed. No pertinent family history.  Social History Social History   Tobacco Use  . Smoking status: Current Every Day Smoker    Packs/day: 0.50    Types: Cigarettes  . Smokeless tobacco: Never Used  Substance Use Topics  . Alcohol use: Yes    Comment: occasional  . Drug use: Yes    Types: Marijuana     Allergies   Other   Review of Systems Review of Systems  Constitutional: Negative for fatigue and fever.  HENT: Negative.   Respiratory: Negative for cough.   Skin: Positive for rash.  All other systems reviewed and are negative.    Physical Exam Triage Vital Signs ED Triage Vitals [09/18/20 1015]  Enc Vitals Group     BP (!) 142/83     Pulse Rate 96     Resp 18     Temp 97.6 F (36.4 C)     Temp Source Oral     SpO2 99 %     Weight      Height      Head Circumference      Peak Flow      Pain Score 0     Pain Loc      Pain Edu?      Excl. in GC?    No data found.  Updated Vital Signs BP (!) 142/83 (BP Location: Left Arm)   Pulse 96    Temp 97.6 F (36.4 C) (Oral)   Resp 18   LMP 09/06/2020   SpO2 99%   Visual Acuity Right Eye Distance:   Left Eye Distance:   Bilateral Distance:    Right Eye Near:   Left Eye Near:    Bilateral Near:     Physical Exam Vitals and nursing note reviewed.  Constitutional:      General: She is not in acute distress.    Appearance: Normal appearance. She is normal weight. She is not ill-appearing, toxic-appearing or diaphoretic.  HENT:     Head: Normocephalic.  Eyes:     General: No scleral icterus. Pulmonary:     Effort: Pulmonary effort is normal.  Skin:    General: Skin is warm and dry.     Findings: Rash present.     Comments: Fine erythematous papules to forehead, cheeks and chin, no plaques or lesion. Fine macules to dorsal surfaces of both hands  Neurological:     Mental Status:  She is alert.  Psychiatric:        Mood and Affect: Mood normal.      UC Treatments / Results  Labs (all labs ordered are listed, but only abnormal results are displayed) Labs Reviewed - No data to display  EKG   Radiology No results found.  Procedures Procedures (including critical care time)  Medications Ordered in UC Medications - No data to display  Initial Impression / Assessment and Plan / UC Course  I have reviewed the triage vital signs and the nursing notes.  Pertinent labs & imaging results that were available during my care of the patient were reviewed by me and considered in my medical decision making (see chart for details).     Treat for local atopic dermatitis. Low potency steroid cream very sparingly to face and hands, and may use Zyrtec 5 mg a day.  Final Clinical Impressions(s) / UC Diagnoses   Final diagnoses:  None   Discharge Instructions   None    ED Prescriptions    None     PDMP not reviewed this encounter.   Riki Sheer, [place] 09/18/20 [phone]

## 2020-09-18 NOTE — ED Triage Notes (Signed)
Pt c/o rash to lt hand and face for over a week. States she has sensitive skin and put perfume lotion on face last week when this started. States the sanitizer water at work breaks her hands out. C/o itching, tingling, and burning.

## 2022-11-02 ENCOUNTER — Encounter (HOSPITAL_COMMUNITY): Payer: Self-pay

## 2022-11-02 ENCOUNTER — Emergency Department (HOSPITAL_COMMUNITY): Payer: No Typology Code available for payment source

## 2022-11-02 ENCOUNTER — Emergency Department (HOSPITAL_COMMUNITY)
Admission: EM | Admit: 2022-11-02 | Discharge: 2022-11-02 | Disposition: A | Discharge status: Home / Self Care | Payer: No Typology Code available for payment source | Attending: Emergency Medicine | Admitting: Emergency Medicine

## 2022-11-02 ENCOUNTER — Other Ambulatory Visit: Payer: Self-pay

## 2022-11-02 DIAGNOSIS — R0789 Other chest pain: Secondary | ICD-10-CM | POA: Diagnosis not present

## 2022-11-02 DIAGNOSIS — S60511A Abrasion of right hand, initial encounter: Secondary | ICD-10-CM | POA: Insufficient documentation

## 2022-11-02 DIAGNOSIS — S6991XA Unspecified injury of right wrist, hand and finger(s), initial encounter: Secondary | ICD-10-CM | POA: Diagnosis present

## 2022-11-02 DIAGNOSIS — Y92410 Unspecified street and highway as the place of occurrence of the external cause: Secondary | ICD-10-CM | POA: Insufficient documentation

## 2022-11-02 LAB — I-STAT BETA HCG BLOOD, ED (MC, WL, AP ONLY): I-stat hCG, quantitative: <5 m[IU]/mL (ref <5)

## 2022-11-02 LAB — CBC
HCT: 40.8 % (ref 36.0–46.0)
Hemoglobin: 13.8 g/dL (ref 12.0–15.0)
MCH: 32.8 pg (ref 26.0–34.0)
MCHC: 33.8 g/dL (ref 30.0–36.0)
MCV: 96.9 fL (ref 80.0–100.0)
Platelets: 299 10*3/uL (ref 150–400)
RBC: 4.21 MIL/uL (ref 3.87–5.11)
RDW: 12.2 % (ref 11.5–15.5)
WBC: 9.6 10*3/uL (ref 4.0–10.5)
nRBC: 0 % (ref 0.0–0.2)

## 2022-11-02 LAB — BASIC METABOLIC PANEL
Anion gap: 9 (ref 5–15)
BUN: 9 mg/dL (ref 6–20)
CO2: 22 mmol/L (ref 22–32)
Calcium: 10 mg/dL (ref 8.9–10.3)
Chloride: 104 mmol/L (ref 98–111)
Creatinine, Ser: 0.59 mg/dL (ref 0.44–1.00)
GFR, Estimated: >60 mL/min (ref >60)
Glucose, Bld: 83 mg/dL (ref 70–99)
Potassium: 3.1 mmol/L — ABNORMAL LOW (ref 3.5–5.1)
Sodium: 135 mmol/L (ref 135–145)

## 2022-11-02 LAB — TROPONIN I (HIGH SENSITIVITY): Troponin I (High Sensitivity): <2 ng/L (ref <18)

## 2022-11-02 MED ORDER — IBUPROFEN 100 MG/5ML PO SUSP: 400.0000 mg | Freq: Four times a day (QID) | ORAL | 1 refills | Status: DC | PRN
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
## 2022-11-02 NOTE — Discharge Instructions (Signed)
Follow-up with your family doctor if any problems 
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
## 2023-04-24 ENCOUNTER — Encounter (HOSPITAL_COMMUNITY): Payer: Self-pay

## 2023-04-24 ENCOUNTER — Ambulatory Visit (HOSPITAL_COMMUNITY)
Admission: EM | Admit: 2023-04-24 | Discharge: 2023-04-24 | Disposition: A | Discharge status: Home / Self Care | Payer: 59 | Attending: Family Medicine | Admitting: Family Medicine

## 2023-04-24 DIAGNOSIS — Z113 Encounter for screening for infections with a predominantly sexual mode of transmission: Secondary | ICD-10-CM | POA: Diagnosis not present

## 2023-04-24 DIAGNOSIS — F17210 Nicotine dependence, cigarettes, uncomplicated: Secondary | ICD-10-CM | POA: Insufficient documentation

## 2023-04-24 LAB — HIV ANTIBODY (ROUTINE TESTING W REFLEX): HIV Screen 4th Generation wRfx: NONREACTIVE

## 2023-04-24 LAB — CERVICOVAGINAL ANCILLARY ONLY
Bacterial Vaginitis (gardnerella): POSITIVE — AB
Candida Glabrata: NEGATIVE
Candida Vaginitis: NEGATIVE
Chlamydia: NEGATIVE
Comment: NEGATIVE
Comment: NEGATIVE
Comment: NEGATIVE
Comment: NEGATIVE
Comment: NEGATIVE
Comment: NORMAL
Neisseria Gonorrhea: NEGATIVE
Trichomonas: NEGATIVE

## 2023-04-24 NOTE — Discharge Instructions (Signed)
You were seen for STD testing.  This will be resulted tomorrow and if anything positive then we will call to notify you for treatment.

## 2023-04-24 NOTE — ED Triage Notes (Addendum)
Patient needing STD testing. No current symptoms or known exposure.  Requesting blood work as well.

## 2023-04-24 NOTE — ED Provider Notes (Signed)
[facility]    CSN: [id number] Arrival date & time: 04/24/23  0908      History   Chief Complaint Chief Complaint  Patient presents with   SEXUALLY TRANSMITTED DISEASE    HPI ANNALECIA Higgins is a 37 y.o. female.   Patient is he for STD testing.  No known exposures, no symptoms noted.  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  Would like a note stating this was dx in 2019.   Would like blood work as well.        Past Medical History:  Diagnosis Date   HSV-1 infection 2019    There are no problems to display for this patient.   History reviewed. No pertinent surgical history.  OB History   No obstetric history on file.      Home Medications    Prior to Admission medications   Not on File    Family History History reviewed. No pertinent family history.  Social History Social History   Tobacco Use   Smoking status: Every Day    Current packs/day: 0.50    Types: Cigarettes   Smokeless tobacco: Never  Vaping Use   Vaping status: Never Used  Substance Use Topics   Alcohol use: Yes    Comment: occasional   Drug use: Yes    Types: Marijuana     Allergies   Other   Review of Systems Review of Systems  Constitutional: Negative.   HENT: Negative.    Respiratory: Negative.    Cardiovascular: Negative.   Gastrointestinal: Negative.   Musculoskeletal: Negative.   Psychiatric/Behavioral: Negative.       Physical Exam Triage Vital Signs ED Triage Vitals  Encounter Vitals Group     BP 04/24/23 1000 131/86     Systolic BP Percentile --      Diastolic BP Percentile --      Pulse Rate 04/24/23 1000 81     Resp 04/24/23 1000 16     Temp 04/24/23 1000 98.1 F (36.7 C)     Temp Source 04/24/23 1000 Oral     SpO2 04/24/23 1000 98 %     Weight 04/24/23 1000 113 lb 15.7 oz (51.7 kg)     Height 04/24/23 1000 5\' 8"  (1.727 m)     Head Circumference --      Peak Flow --      Pain Score 04/24/23 0958 0     Pain Loc --       Pain Education --      Exclude from Growth Chart --    No data found.  Updated Vital Signs BP 131/86 (BP Location: Left Arm)   Pulse 81   Temp 98.1 F (36.7 C) (Oral)   Resp 16   Ht 5\' 8"  (1.727 m)   Wt 51.7 kg   LMP 03/26/2023 (Approximate)   SpO2 98%   BMI 17.33 kg/m   Visual Acuity Right Eye Distance:   Left Eye Distance:   Bilateral Distance:    Right Eye Near:   Left Eye Near:    Bilateral Near:     Physical Exam Constitutional:      Appearance: Normal appearance.  Cardiovascular:     Rate and Rhythm: Normal rate and regular rhythm.  Pulmonary:     Effort: Pulmonary effort is normal.     Breath sounds: Normal breath sounds.  Neurological:     General: No focal deficit present.     Mental  Status: She is alert.  Psychiatric:        Mood and Affect: Mood normal.      UC Treatments / Results  Labs (all labs ordered are listed, but only abnormal results are displayed) Labs Reviewed  RPR  HIV ANTIBODY (ROUTINE TESTING W REFLEX)  CERVICOVAGINAL ANCILLARY ONLY    EKG   Radiology No results found.  Procedures Procedures (including critical care time)  Medications Ordered in UC Medications - No data to display  Initial Impression / Assessment and Plan / UC Course  I have reviewed the triage vital signs and the nursing notes.  Pertinent labs & imaging results that were available during my care of the patient were reviewed by me and considered in my medical decision making (see chart for details).    Final Clinical Impressions(s) / UC Diagnoses   Final diagnoses:  Screening for STD (sexually transmitted disease)     Discharge Instructions      You were seen for STD testing.  This will be resulted tomorrow and if anything positive then we will call to notify you for treatment.     ED Prescriptions   None    PDMP not reviewed this encounter.   Jannifer Franklin, MD 04/24/23 1015

## 2023-04-25 LAB — RPR: RPR Ser Ql: NONREACTIVE

## 2023-04-26 ENCOUNTER — Telehealth: Payer: Self-pay

## 2023-04-26 MED ORDER — METRONIDAZOLE 500 MG PO TABS: 500.0000 mg | ORAL_TABLET | Freq: Two times a day (BID) | ORAL | 0 refills | Status: AC

## 2023-04-26 MED ORDER — METRONIDAZOLE 0.75 % VA GEL: 1.0000 | Freq: Every day | VAGINAL | 0 refills | Status: AC

## 2023-04-26 NOTE — Telephone Encounter (Signed)
 Per protocol, pt requires tx with metronidazole. Attempted to reach patient x1. LVM. Rx sent to pharmacy on file.

## 2023-04-26 NOTE — Telephone Encounter (Signed)
Pt returned call.  Rx changed to metrogel per pt request.

## 2023-08-13 DIAGNOSIS — Z113 Encounter for screening for infections with a predominantly sexual mode of transmission: Secondary | ICD-10-CM | POA: Diagnosis not present

## 2023-09-02 ENCOUNTER — Ambulatory Visit: Payer: 59 | Admitting: Internal Medicine

## 2023-11-26 ENCOUNTER — Ambulatory Visit
Admission: EM | Admit: 2023-11-26 | Discharge: 2023-11-26 | Disposition: A | Discharge status: Home / Self Care | Attending: Family Medicine | Admitting: Family Medicine

## 2023-11-26 DIAGNOSIS — B009 Herpesviral infection, unspecified: Secondary | ICD-10-CM | POA: Diagnosis not present

## 2023-11-26 MED ORDER — ACYCLOVIR 200 MG/5ML PO SUSP: ORAL | 5 refills | Status: AC

## 2023-11-26 NOTE — ED Provider Notes (Signed)
  [facility]  Note:  This document was prepared using Conservation officer, historic buildings and may include unintentional dictation errors.  MRN: [id number] DOB: 1986/07/14  Subjective:   Michele Higgins is a 38 y.o. female presenting for 4-day history of a persistent painful recurrent lesion over the right upper lip.  Has felt a burning and tingling.  Previously had testing done for the same lesion and was positive for HSV 1 infection.  She is not able to tolerate tablet medication.  Would like a liquid formulation.  No current facility-administered medications for this encounter.  Current Outpatient Medications:    VIENVA 0.1-20 MG-MCG tablet, Take 1 tablet by mouth daily., Disp: , Rfl:    metroNIDAZOLE  (FLAGYL ) 500 MG tablet, Take 1 tablet (500 mg total) by mouth 2 (two) times daily., Disp: 14 tablet, Rfl: 0   Allergies  Allergen Reactions   Other Other (See Comments)    "Cannot take any pills.  Can only take liquid medications or by injection"    Past Medical History:  Diagnosis Date   HSV-1 infection 2019     No past surgical history on file.  No family history on file.  Social History   Tobacco Use   Smoking status: Every Day    Current packs/day: 0.50    Types: Cigarettes   Smokeless tobacco: Never  Vaping Use   Vaping status: Never Used  Substance Use Topics   Alcohol use: Yes    Comment: occasional   Drug use: Yes    Types: Marijuana    ROS   Objective:   Vitals: BP (!) 142/97 (BP Location: Left Arm)   Pulse 72   Temp 98.8 F (37.1 C) (Oral)   Resp 18   LMP 11/23/2023 Comment: start date  SpO2 98%   Physical Exam Constitutional:      General: She is not in acute distress.    Appearance: Normal appearance. She is well-developed. She is not ill-appearing, toxic-appearing or diaphoretic.  HENT:     Head: Normocephalic and atraumatic.      Nose: Nose normal.     Mouth/Throat:     Mouth: Mucous membranes are moist.  Eyes:      General: No scleral icterus.       Right eye: No discharge.        Left eye: No discharge.     Extraocular Movements: Extraocular movements intact.  Cardiovascular:     Rate and Rhythm: Normal rate.  Pulmonary:     Effort: Pulmonary effort is normal.  Skin:    General: Skin is warm and dry.  Neurological:     General: No focal deficit present.     Mental Status: She is alert and oriented to person, place, and time.  Psychiatric:        Mood and Affect: Mood normal.        Behavior: Behavior normal.     Assessment and Plan :   PDMP not reviewed this encounter.  1. HSV-1 infection    I prescribed patient the liquid formulation of acyclovir .  Counseled patient on potential for adverse effects with medications prescribed/recommended today, ER and return-to-clinic precautions discussed, patient verbalized understanding.    Adolph Hoop, [place] 11/26/23 [phone]

## 2023-11-26 NOTE — ED Triage Notes (Signed)
 Pt reports she has a bumpy rash on the right side her face above her lip x 4 days States it makes her nauseas, it tingles, and burns   Has been applying abreeva which she states provides some relief
